# Patient Record
Sex: Female | Born: 1990
Health system: Southern US, Community
[De-identification: ages and names within clinical notes are randomized; demographics above are authoritative.]

## PROBLEM LIST (undated history)

## (undated) ENCOUNTER — Inpatient Hospital Stay (HOSPITAL_COMMUNITY): Payer: Self-pay

## (undated) DIAGNOSIS — O24419 Gestational diabetes mellitus in pregnancy, unspecified control: Secondary | ICD-10-CM

## (undated) DIAGNOSIS — O139 Gestational [pregnancy-induced] hypertension without significant proteinuria, unspecified trimester: Secondary | ICD-10-CM

## (undated) DIAGNOSIS — T4145XA Adverse effect of unspecified anesthetic, initial encounter: Secondary | ICD-10-CM

## (undated) DIAGNOSIS — T8859XA Other complications of anesthesia, initial encounter: Secondary | ICD-10-CM

## (undated) DIAGNOSIS — R112 Nausea with vomiting, unspecified: Secondary | ICD-10-CM

## (undated) DIAGNOSIS — Z9889 Other specified postprocedural states: Secondary | ICD-10-CM

## (undated) HISTORY — PX: BUNIONECTOMY: SHX129

## (undated) HISTORY — DX: Gestational (pregnancy-induced) hypertension without significant proteinuria, unspecified trimester: O13.9

## (undated) HISTORY — DX: Gestational diabetes mellitus in pregnancy, unspecified control: O24.419

---
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
## 2017-01-09 DIAGNOSIS — R030 Elevated blood-pressure reading, without diagnosis of hypertension: Secondary | ICD-10-CM | POA: Diagnosis not present

## 2017-01-24 DIAGNOSIS — R42 Dizziness and giddiness: Secondary | ICD-10-CM | POA: Diagnosis not present

## 2017-01-31 DIAGNOSIS — Z0001 Encounter for general adult medical examination with abnormal findings: Secondary | ICD-10-CM | POA: Diagnosis not present

## 2017-01-31 DIAGNOSIS — Z0000 Encounter for general adult medical examination without abnormal findings: Secondary | ICD-10-CM | POA: Diagnosis not present

## 2017-01-31 DIAGNOSIS — Z124 Encounter for screening for malignant neoplasm of cervix: Secondary | ICD-10-CM | POA: Diagnosis not present

## 2017-03-07 DIAGNOSIS — J029 Acute pharyngitis, unspecified: Secondary | ICD-10-CM | POA: Diagnosis not present

## 2017-05-01 DIAGNOSIS — M5137 Other intervertebral disc degeneration, lumbosacral region: Secondary | ICD-10-CM | POA: Diagnosis not present

## 2017-05-01 DIAGNOSIS — M9903 Segmental and somatic dysfunction of lumbar region: Secondary | ICD-10-CM | POA: Diagnosis not present

## 2017-05-01 DIAGNOSIS — M4607 Spinal enthesopathy, lumbosacral region: Secondary | ICD-10-CM | POA: Diagnosis not present

## 2017-05-03 DIAGNOSIS — M5137 Other intervertebral disc degeneration, lumbosacral region: Secondary | ICD-10-CM | POA: Diagnosis not present

## 2017-05-03 DIAGNOSIS — M4607 Spinal enthesopathy, lumbosacral region: Secondary | ICD-10-CM | POA: Diagnosis not present

## 2017-05-03 DIAGNOSIS — M9903 Segmental and somatic dysfunction of lumbar region: Secondary | ICD-10-CM | POA: Diagnosis not present

## 2017-05-08 DIAGNOSIS — M5137 Other intervertebral disc degeneration, lumbosacral region: Secondary | ICD-10-CM | POA: Diagnosis not present

## 2017-05-08 DIAGNOSIS — M9903 Segmental and somatic dysfunction of lumbar region: Secondary | ICD-10-CM | POA: Diagnosis not present

## 2017-05-08 DIAGNOSIS — M4607 Spinal enthesopathy, lumbosacral region: Secondary | ICD-10-CM | POA: Diagnosis not present

## 2017-05-11 DIAGNOSIS — M9903 Segmental and somatic dysfunction of lumbar region: Secondary | ICD-10-CM | POA: Diagnosis not present

## 2017-05-11 DIAGNOSIS — M4607 Spinal enthesopathy, lumbosacral region: Secondary | ICD-10-CM | POA: Diagnosis not present

## 2017-05-11 DIAGNOSIS — M5137 Other intervertebral disc degeneration, lumbosacral region: Secondary | ICD-10-CM | POA: Diagnosis not present

## 2017-05-15 DIAGNOSIS — M9903 Segmental and somatic dysfunction of lumbar region: Secondary | ICD-10-CM | POA: Diagnosis not present

## 2017-05-15 DIAGNOSIS — M5137 Other intervertebral disc degeneration, lumbosacral region: Secondary | ICD-10-CM | POA: Diagnosis not present

## 2017-05-15 DIAGNOSIS — M4607 Spinal enthesopathy, lumbosacral region: Secondary | ICD-10-CM | POA: Diagnosis not present

## 2017-05-17 DIAGNOSIS — M5137 Other intervertebral disc degeneration, lumbosacral region: Secondary | ICD-10-CM | POA: Diagnosis not present

## 2017-05-17 DIAGNOSIS — M9903 Segmental and somatic dysfunction of lumbar region: Secondary | ICD-10-CM | POA: Diagnosis not present

## 2017-05-17 DIAGNOSIS — M4607 Spinal enthesopathy, lumbosacral region: Secondary | ICD-10-CM | POA: Diagnosis not present

## 2017-07-17 DIAGNOSIS — N390 Urinary tract infection, site not specified: Secondary | ICD-10-CM | POA: Diagnosis not present

## 2017-08-04 DIAGNOSIS — N390 Urinary tract infection, site not specified: Secondary | ICD-10-CM | POA: Diagnosis not present

## 2017-08-25 DIAGNOSIS — J988 Other specified respiratory disorders: Secondary | ICD-10-CM | POA: Diagnosis not present

## 2017-10-12 DIAGNOSIS — Z3201 Encounter for pregnancy test, result positive: Secondary | ICD-10-CM | POA: Diagnosis not present

## 2017-10-26 DIAGNOSIS — Z3401 Encounter for supervision of normal first pregnancy, first trimester: Secondary | ICD-10-CM | POA: Diagnosis not present

## 2017-10-26 LAB — OB RESULTS CONSOLE ABO/RH: RH Type: POSITIVE

## 2017-10-26 LAB — OB RESULTS CONSOLE HEPATITIS B SURFACE ANTIGEN: Hepatitis B Surface Ag: NEGATIVE

## 2017-10-26 LAB — OB RESULTS CONSOLE ANTIBODY SCREEN: Antibody Screen: NEGATIVE

## 2017-10-26 LAB — OB RESULTS CONSOLE GC/CHLAMYDIA
CHLAMYDIA, DNA PROBE: NEGATIVE
GC PROBE AMP, GENITAL: NEGATIVE

## 2017-10-26 LAB — OB RESULTS CONSOLE RUBELLA ANTIBODY, IGM: Rubella: IMMUNE

## 2017-10-26 LAB — OB RESULTS CONSOLE RPR: RPR: NONREACTIVE

## 2017-10-26 LAB — OB RESULTS CONSOLE HIV ANTIBODY (ROUTINE TESTING): HIV: NONREACTIVE

## 2017-12-13 DIAGNOSIS — R1110 Vomiting, unspecified: Secondary | ICD-10-CM | POA: Diagnosis not present

## 2018-03-13 DIAGNOSIS — Z3A29 29 weeks gestation of pregnancy: Secondary | ICD-10-CM | POA: Diagnosis not present

## 2018-03-13 DIAGNOSIS — O99810 Abnormal glucose complicating pregnancy: Secondary | ICD-10-CM | POA: Diagnosis not present

## 2018-04-04 ENCOUNTER — Encounter: Payer: 59 | Attending: Obstetrics and Gynecology | Admitting: Registered"

## 2018-04-04 DIAGNOSIS — Z713 Dietary counseling and surveillance: Secondary | ICD-10-CM | POA: Insufficient documentation

## 2018-04-04 DIAGNOSIS — Z23 Encounter for immunization: Secondary | ICD-10-CM | POA: Diagnosis not present

## 2018-04-04 DIAGNOSIS — O99810 Abnormal glucose complicating pregnancy: Secondary | ICD-10-CM | POA: Insufficient documentation

## 2018-04-06 ENCOUNTER — Encounter: Payer: Self-pay | Admitting: Registered"

## 2018-04-06 DIAGNOSIS — O99810 Abnormal glucose complicating pregnancy: Secondary | ICD-10-CM | POA: Insufficient documentation

## 2018-04-06 NOTE — Progress Notes (Signed)
Patient was seen on 04/04/2018 for Gestational Diabetes self-management class at the [facility]. The following learning objectives were met by the patient during this course:   States the definition of Gestational Diabetes  States why dietary management is important in controlling blood glucose  Describes the effects each nutrient has on blood glucose levels  Demonstrates ability to create a balanced meal plan  Demonstrates carbohydrate counting   States when to check blood glucose levels  Demonstrates proper blood glucose monitoring techniques  States the effect of stress and exercise on blood glucose levels  States the importance of limiting caffeine and abstaining from alcohol and smoking  Blood glucose monitor given: [employer] Lot # [id number] X Exp: 02/24/19 Blood glucose reading: 116  Patient instructed to monitor glucose levels: FBS: 60 - <95; 1 hour: <140; 2 hour: <120  Patient received handouts:  Nutrition Diabetes and Pregnancy, including carb counting list  Patient will be seen for follow-up as needed.

## 2018-04-10 DIAGNOSIS — Z3482 Encounter for supervision of other normal pregnancy, second trimester: Secondary | ICD-10-CM | POA: Diagnosis not present

## 2018-04-10 DIAGNOSIS — Z3483 Encounter for supervision of other normal pregnancy, third trimester: Secondary | ICD-10-CM | POA: Diagnosis not present

## 2018-04-16 DIAGNOSIS — O24419 Gestational diabetes mellitus in pregnancy, unspecified control: Secondary | ICD-10-CM | POA: Diagnosis not present

## 2018-04-16 DIAGNOSIS — Z3A34 34 weeks gestation of pregnancy: Secondary | ICD-10-CM | POA: Diagnosis not present

## 2018-04-16 DIAGNOSIS — O3663X0 Maternal care for excessive fetal growth, third trimester, not applicable or unspecified: Secondary | ICD-10-CM | POA: Diagnosis not present

## 2018-04-24 DIAGNOSIS — Z3685 Encounter for antenatal screening for Streptococcus B: Secondary | ICD-10-CM | POA: Diagnosis not present

## 2018-04-24 LAB — OB RESULTS CONSOLE GBS: STREP GROUP B AG: NEGATIVE

## 2018-05-03 DIAGNOSIS — Z3A37 37 weeks gestation of pregnancy: Secondary | ICD-10-CM | POA: Diagnosis not present

## 2018-05-03 DIAGNOSIS — O368330 Maternal care for abnormalities of the fetal heart rate or rhythm, third trimester, not applicable or unspecified: Secondary | ICD-10-CM | POA: Diagnosis not present

## 2018-05-16 ENCOUNTER — Other Ambulatory Visit: Payer: Self-pay | Admitting: Obstetrics and Gynecology

## 2018-05-16 ENCOUNTER — Telehealth (HOSPITAL_COMMUNITY): Payer: Self-pay | Admitting: *Deleted

## 2018-05-16 ENCOUNTER — Encounter (HOSPITAL_COMMUNITY): Payer: Self-pay | Admitting: *Deleted

## 2018-05-16 NOTE — Telephone Encounter (Signed)
Preadmission screen  

## 2018-05-17 ENCOUNTER — Inpatient Hospital Stay (HOSPITAL_COMMUNITY)
Admission: AD | Admit: 2018-05-17 | Discharge: 2018-05-17 | Disposition: A | Discharge status: Home / Self Care | Payer: 59 | Source: Ambulatory Visit | Attending: Obstetrics and Gynecology | Admitting: Obstetrics and Gynecology

## 2018-05-17 ENCOUNTER — Other Ambulatory Visit: Payer: Self-pay

## 2018-05-17 ENCOUNTER — Encounter (HOSPITAL_COMMUNITY): Payer: Self-pay | Admitting: *Deleted

## 2018-05-17 DIAGNOSIS — Z3A39 39 weeks gestation of pregnancy: Secondary | ICD-10-CM | POA: Insufficient documentation

## 2018-05-17 DIAGNOSIS — O471 False labor at or after 37 completed weeks of gestation: Secondary | ICD-10-CM | POA: Insufficient documentation

## 2018-05-17 NOTE — Discharge Instructions (Signed)
Braxton Hicks Contractions °Contractions of the uterus can occur throughout pregnancy, but they are not always a sign that you are in labor. You may have practice contractions called Braxton Hicks contractions. These false labor contractions are sometimes confused with true labor. °What are Braxton Hicks contractions? °Braxton Hicks contractions are tightening movements that occur in the muscles of the uterus before labor. Unlike true labor contractions, these contractions do not result in opening (dilation) and thinning of the cervix. Toward the end of pregnancy (32-34 weeks), Braxton Hicks contractions can happen more often and may become stronger. These contractions are sometimes difficult to tell apart from true labor because they can be very uncomfortable. You should not feel embarrassed if you go to the hospital with false labor. °Sometimes, the only way to tell if you are in true labor is for your health care provider to look for changes in the cervix. The health care provider will do a physical exam and may monitor your contractions. If you are not in true labor, the exam should show that your cervix is not dilating and your water has not broken. °If there are other health problems associated with your pregnancy, it is completely safe for you to be sent home with false labor. You may continue to have Braxton Hicks contractions until you go into true labor. °How to tell the difference between true labor and false labor °True labor °· Contractions last 30-70 seconds. °· Contractions become very regular. °· Discomfort is usually felt in the top of the uterus, and it spreads to the lower abdomen and low back. °· Contractions do not go away with walking. °· Contractions usually become more intense and increase in frequency. °· The cervix dilates and gets thinner. °False labor °· Contractions are usually shorter and not as strong as true labor contractions. °· Contractions are usually irregular. °· Contractions  are often felt in the front of the lower abdomen and in the groin. °· Contractions may go away when you walk around or change positions while lying down. °· Contractions get weaker and are shorter-lasting as time goes on. °· The cervix usually does not dilate or become thin. °Follow these instructions at home: °· Take over-the-counter and prescription medicines only as told by your health care provider. °· Keep up with your usual exercises and follow other instructions from your health care provider. °· Eat and drink lightly if you think you are going into labor. °· If Braxton Hicks contractions are making you uncomfortable: °? Change your position from lying down or resting to walking, or change from walking to resting. °? Sit and rest in a tub of warm water. °? Drink enough fluid to keep your urine pale yellow. Dehydration may cause these contractions. °? Do slow and deep breathing several times an hour. °· Keep all follow-up prenatal visits as told by your health care provider. This is important. °Contact a health care provider if: °· You have a fever. °· You have continuous pain in your abdomen. °Get help right away if: °· Your contractions become stronger, more regular, and closer together. °· You have fluid leaking or gushing from your vagina. °· You pass blood-tinged mucus (bloody show). °· You have bleeding from your vagina. °· You have low back pain that you never had before. °· You feel your baby’s head pushing down and causing pelvic pressure. °· Your baby is not moving inside you as much as it used to. °Summary °· Contractions that occur before labor are called Braxton   Hicks contractions, false labor, or practice contractions. °· Braxton Hicks contractions are usually shorter, weaker, farther apart, and less regular than true labor contractions. True labor contractions usually become progressively stronger and regular and they become more frequent. °· Manage discomfort from Braxton Hicks contractions by  changing position, resting in a warm bath, drinking plenty of water, or practicing deep breathing. °This information is not intended to replace advice given to you by your health care provider. Make sure you discuss any questions you have with your health care provider. °Document Released: 01/26/2017 Document Revised: 01/26/2017 Document Reviewed: 01/26/2017 °Elsevier Interactive Patient Education © 2018 [employer] ° °

## 2018-05-17 NOTE — MAU Note (Addendum)
Having pretty regular contractions, was 3 cm on Tues. Getting intense, back is killing her.  Had small amt if ?water leakage, this morning once and once  this afternoon.  No bleeding.

## 2018-05-17 NOTE — MAU Provider Note (Signed)
Good FM Prodromal labor BP 133/87 (BP Location: Right Arm)   Pulse (!) 117   Temp 97.7 F (36.5 C) (Oral)   Resp 20   Ht 5' 8.5" (1.74 m)   Wt 112.7 kg   SpO2 100%   BMI 37.23 kg/m   Category 1 tracing DC home

## 2018-05-21 DIAGNOSIS — M5489 Other dorsalgia: Secondary | ICD-10-CM | POA: Diagnosis not present

## 2018-05-23 ENCOUNTER — Encounter (HOSPITAL_COMMUNITY)
Admission: RE | Disposition: A | Discharge status: Home / Self Care | Payer: Self-pay | Source: Home / Self Care | Attending: Obstetrics and Gynecology

## 2018-05-23 ENCOUNTER — Inpatient Hospital Stay (HOSPITAL_COMMUNITY): Payer: 59 | Admitting: Anesthesiology

## 2018-05-23 ENCOUNTER — Inpatient Hospital Stay (HOSPITAL_COMMUNITY)
Admission: RE | Admit: 2018-05-23 | Discharge: 2018-05-25 | DRG: 787 | Disposition: A | Discharge status: Home / Self Care | Payer: 59 | Attending: Obstetrics and Gynecology | Admitting: Obstetrics and Gynecology

## 2018-05-23 ENCOUNTER — Encounter (HOSPITAL_COMMUNITY): Payer: Self-pay

## 2018-05-23 DIAGNOSIS — Z3490 Encounter for supervision of normal pregnancy, unspecified, unspecified trimester: Secondary | ICD-10-CM | POA: Diagnosis present

## 2018-05-23 DIAGNOSIS — Z3A39 39 weeks gestation of pregnancy: Secondary | ICD-10-CM

## 2018-05-23 DIAGNOSIS — O24429 Gestational diabetes mellitus in childbirth, unspecified control: Secondary | ICD-10-CM | POA: Diagnosis not present

## 2018-05-23 DIAGNOSIS — O9081 Anemia of the puerperium: Secondary | ICD-10-CM | POA: Diagnosis not present

## 2018-05-23 DIAGNOSIS — O24420 Gestational diabetes mellitus in childbirth, diet controlled: Secondary | ICD-10-CM | POA: Diagnosis not present

## 2018-05-23 DIAGNOSIS — O3663X0 Maternal care for excessive fetal growth, third trimester, not applicable or unspecified: Secondary | ICD-10-CM | POA: Diagnosis not present

## 2018-05-23 DIAGNOSIS — D62 Acute posthemorrhagic anemia: Secondary | ICD-10-CM | POA: Diagnosis not present

## 2018-05-23 LAB — GLUCOSE, CAPILLARY
GLUCOSE-CAPILLARY: 76 mg/dL (ref 70–99)
Glucose-Capillary: 129 mg/dL — ABNORMAL HIGH (ref 70–99)
Glucose-Capillary: 83 mg/dL (ref 70–99)

## 2018-05-23 LAB — CBC
HCT: 33.8 % — ABNORMAL LOW (ref 36.0–46.0)
Hemoglobin: 11.2 g/dL — ABNORMAL LOW (ref 12.0–15.0)
MCH: 25.2 pg — ABNORMAL LOW (ref 26.0–34.0)
MCHC: 33.1 g/dL (ref 30.0–36.0)
MCV: 76.1 fL — ABNORMAL LOW (ref 78.0–100.0)
PLATELETS: 203 10*3/uL (ref 150–400)
RBC: 4.44 MIL/uL (ref 3.87–5.11)
RDW: 14.6 % (ref 11.5–15.5)
WBC: 16.8 10*3/uL — ABNORMAL HIGH (ref 4.0–10.5)

## 2018-05-23 LAB — ABO/RH: ABO/RH(D): O POS

## 2018-05-23 LAB — TYPE AND SCREEN
ABO/RH(D): O POS
ANTIBODY SCREEN: NEGATIVE

## 2018-05-23 SURGERY — Surgical Case
Anesthesia: Regional

## 2018-05-23 MED ORDER — PROMETHAZINE HCL 25 MG/ML IJ SOLN: 6.2500 mg | INTRAMUSCULAR | Status: DC | PRN

## 2018-05-23 MED ORDER — ACETAMINOPHEN 500 MG PO TABS
1000.0000 mg | ORAL_TABLET | Freq: Four times a day (QID) | ORAL | Status: AC
  Administered 2018-05-24 (×4): 1000 mg via ORAL
  Filled 2018-05-23 (×4): qty 2

## 2018-05-23 MED ORDER — ONDANSETRON HCL 4 MG/2ML IJ SOLN: 4.0000 mg | Freq: Three times a day (TID) | INTRAMUSCULAR | Status: DC | PRN

## 2018-05-23 MED ORDER — TETANUS-DIPHTH-ACELL PERTUSSIS 5-2.5-18.5 LF-MCG/0.5 IM SUSP: 0.5000 mL | Freq: Once | INTRAMUSCULAR | Status: DC

## 2018-05-23 MED ORDER — LACTATED RINGERS IV SOLN: 500.0000 mL | Freq: Once | INTRAVENOUS | Status: DC

## 2018-05-23 MED ORDER — NALBUPHINE HCL 10 MG/ML IJ SOLN: 5.0000 mg | INTRAMUSCULAR | Status: DC | PRN

## 2018-05-23 MED ORDER — SIMETHICONE 80 MG PO CHEW: 80.0000 mg | CHEWABLE_TABLET | ORAL | Status: DC | PRN

## 2018-05-23 MED ORDER — CEFAZOLIN SODIUM-DEXTROSE 2-3 GM-%(50ML) IV SOLR
INTRAVENOUS | Status: DC | PRN
  Administered 2018-05-23: 2 g via INTRAVENOUS

## 2018-05-23 MED ORDER — PRENATAL MULTIVITAMIN CH
1.0000 | ORAL_TABLET | Freq: Every day | ORAL | Status: DC
  Administered 2018-05-24 – 2018-05-25 (×2): 1 via ORAL
  Filled 2018-05-23 (×2): qty 1

## 2018-05-23 MED ORDER — KETOROLAC TROMETHAMINE 30 MG/ML IJ SOLN: 30.0000 mg | Freq: Once | INTRAMUSCULAR | Status: DC | PRN

## 2018-05-23 MED ORDER — OXYTOCIN 40 UNITS IN LACTATED RINGERS INFUSION - SIMPLE MED: 2.5000 [IU]/h | INTRAVENOUS | Status: DC

## 2018-05-23 MED ORDER — WITCH HAZEL-GLYCERIN EX PADS: 1.0000 "application " | MEDICATED_PAD | CUTANEOUS | Status: DC | PRN

## 2018-05-23 MED ORDER — STERILE WATER FOR IRRIGATION IR SOLN
Status: DC | PRN
  Administered 2018-05-23: 1000 mL

## 2018-05-23 MED ORDER — SCOPOLAMINE 1 MG/3DAYS TD PT72
MEDICATED_PATCH | TRANSDERMAL | Status: DC | PRN
  Administered 2018-05-23: 1 via TRANSDERMAL

## 2018-05-23 MED ORDER — LACTATED RINGERS IV SOLN
INTRAVENOUS | Status: DC
  Administered 2018-05-24: 05:00:00 via INTRAVENOUS

## 2018-05-23 MED ORDER — ONDANSETRON HCL 4 MG/2ML IJ SOLN
4.0000 mg | Freq: Four times a day (QID) | INTRAMUSCULAR | Status: DC | PRN
  Administered 2018-05-23: 4 mg via INTRAVENOUS
  Filled 2018-05-23: qty 2

## 2018-05-23 MED ORDER — SCOPOLAMINE 1 MG/3DAYS TD PT72
MEDICATED_PATCH | TRANSDERMAL | Status: AC
  Filled 2018-05-23: qty 1

## 2018-05-23 MED ORDER — DIPHENHYDRAMINE HCL 25 MG PO CAPS: 25.0000 mg | ORAL_CAPSULE | ORAL | Status: DC | PRN

## 2018-05-23 MED ORDER — TERBUTALINE SULFATE 1 MG/ML IJ SOLN: 0.2500 mg | Freq: Once | INTRAMUSCULAR | Status: DC | PRN

## 2018-05-23 MED ORDER — PHENYLEPHRINE 40 MCG/ML (10ML) SYRINGE FOR IV PUSH (FOR BLOOD PRESSURE SUPPORT)
80.0000 ug | PREFILLED_SYRINGE | INTRAVENOUS | Status: DC | PRN
  Administered 2018-05-23: 80 ug via INTRAVENOUS
  Filled 2018-05-23 (×2): qty 10

## 2018-05-23 MED ORDER — BUPIVACAINE HCL (PF) 0.5 % IJ SOLN
INTRAMUSCULAR | Status: AC
  Filled 2018-05-23: qty 30

## 2018-05-23 MED ORDER — ZOLPIDEM TARTRATE 5 MG PO TABS: 5.0000 mg | ORAL_TABLET | Freq: Every evening | ORAL | Status: DC | PRN

## 2018-05-23 MED ORDER — MORPHINE SULFATE (PF) 0.5 MG/ML IJ SOLN
INTRAMUSCULAR | Status: AC
  Filled 2018-05-23: qty 10

## 2018-05-23 MED ORDER — DIBUCAINE 1 % RE OINT: 1.0000 "application " | TOPICAL_OINTMENT | RECTAL | Status: DC | PRN

## 2018-05-23 MED ORDER — MORPHINE SULFATE (PF) 0.5 MG/ML IJ SOLN
INTRAMUSCULAR | Status: DC | PRN
  Administered 2018-05-23: 3 mg via EPIDURAL

## 2018-05-23 MED ORDER — SIMETHICONE 80 MG PO CHEW
80.0000 mg | CHEWABLE_TABLET | ORAL | Status: DC
  Administered 2018-05-24 (×2): 80 mg via ORAL
  Filled 2018-05-23 (×2): qty 1

## 2018-05-23 MED ORDER — FENTANYL 2.5 MCG/ML BUPIVACAINE 1/10 % EPIDURAL INFUSION (WH - ANES)
14.0000 mL/h | INTRAMUSCULAR | Status: DC | PRN
  Administered 2018-05-23: 14 mL/h via EPIDURAL
  Filled 2018-05-23: qty 100

## 2018-05-23 MED ORDER — MEPERIDINE HCL 25 MG/ML IJ SOLN: 6.2500 mg | INTRAMUSCULAR | Status: DC | PRN

## 2018-05-23 MED ORDER — DEXAMETHASONE SODIUM PHOSPHATE 10 MG/ML IJ SOLN
INTRAMUSCULAR | Status: DC | PRN
  Administered 2018-05-23: 10 mg via INTRAVENOUS

## 2018-05-23 MED ORDER — KETOROLAC TROMETHAMINE 30 MG/ML IJ SOLN: 30.0000 mg | Freq: Four times a day (QID) | INTRAMUSCULAR | Status: AC | PRN

## 2018-05-23 MED ORDER — NALBUPHINE HCL 10 MG/ML IJ SOLN: 5.0000 mg | Freq: Once | INTRAMUSCULAR | Status: DC | PRN

## 2018-05-23 MED ORDER — CEFAZOLIN SODIUM-DEXTROSE 2-4 GM/100ML-% IV SOLN
INTRAVENOUS | Status: AC
  Filled 2018-05-23: qty 100

## 2018-05-23 MED ORDER — METHYLERGONOVINE MALEATE 0.2 MG/ML IJ SOLN: 0.2000 mg | INTRAMUSCULAR | Status: DC | PRN

## 2018-05-23 MED ORDER — DIPHENHYDRAMINE HCL 50 MG/ML IJ SOLN: 12.5000 mg | INTRAMUSCULAR | Status: DC | PRN

## 2018-05-23 MED ORDER — SODIUM BICARBONATE 8.4 % IV SOLN
INTRAVENOUS | Status: DC | PRN
  Administered 2018-05-23: 10 mL via EPIDURAL

## 2018-05-23 MED ORDER — OXYTOCIN 10 UNIT/ML IJ SOLN
INTRAVENOUS | Status: DC | PRN
  Administered 2018-05-23: 40 [IU] via INTRAVENOUS

## 2018-05-23 MED ORDER — OXYTOCIN 40 UNITS IN LACTATED RINGERS INFUSION - SIMPLE MED
1.0000 m[IU]/min | INTRAVENOUS | Status: DC
  Administered 2018-05-23: 2 m[IU]/min via INTRAVENOUS
  Filled 2018-05-23: qty 1000

## 2018-05-23 MED ORDER — ACETAMINOPHEN 325 MG PO TABS: 650.0000 mg | ORAL_TABLET | ORAL | Status: DC | PRN

## 2018-05-23 MED ORDER — BUPIVACAINE HCL (PF) 0.25 % IJ SOLN
INTRAMUSCULAR | Status: DC | PRN
  Administered 2018-05-23: 30 mL

## 2018-05-23 MED ORDER — MENTHOL 3 MG MT LOZG: 1.0000 | LOZENGE | OROMUCOSAL | Status: DC | PRN

## 2018-05-23 MED ORDER — COCONUT OIL OIL: 1.0000 "application " | TOPICAL_OIL | Status: DC | PRN

## 2018-05-23 MED ORDER — EPHEDRINE 5 MG/ML INJ
10.0000 mg | INTRAVENOUS | Status: AC | PRN
  Administered 2018-05-23 (×2): 10 mg via INTRAVENOUS

## 2018-05-23 MED ORDER — OXYTOCIN BOLUS FROM INFUSION: 500.0000 mL | Freq: Once | INTRAVENOUS | Status: DC

## 2018-05-23 MED ORDER — LIDOCAINE HCL (PF) 1 % IJ SOLN
INTRAMUSCULAR | Status: DC | PRN
  Administered 2018-05-23: 4 mL via EPIDURAL
  Administered 2018-05-23: 9 mL via EPIDURAL
  Administered 2018-05-23: 4 mL via EPIDURAL

## 2018-05-23 MED ORDER — ONDANSETRON HCL 4 MG/2ML IJ SOLN
INTRAMUSCULAR | Status: DC | PRN
  Administered 2018-05-23: 4 mg via INTRAVENOUS

## 2018-05-23 MED ORDER — NALOXONE HCL 0.4 MG/ML IJ SOLN: 0.4000 mg | INTRAMUSCULAR | Status: DC | PRN

## 2018-05-23 MED ORDER — SODIUM CHLORIDE 0.9% FLUSH: 3.0000 mL | INTRAVENOUS | Status: DC | PRN

## 2018-05-23 MED ORDER — ONDANSETRON HCL 4 MG/2ML IJ SOLN
INTRAMUSCULAR | Status: AC
  Filled 2018-05-23: qty 2

## 2018-05-23 MED ORDER — OXYCODONE-ACETAMINOPHEN 5-325 MG PO TABS: 2.0000 | ORAL_TABLET | ORAL | Status: DC | PRN

## 2018-05-23 MED ORDER — PHENYLEPHRINE 40 MCG/ML (10ML) SYRINGE FOR IV PUSH (FOR BLOOD PRESSURE SUPPORT)
80.0000 ug | PREFILLED_SYRINGE | INTRAVENOUS | Status: AC | PRN
  Administered 2018-05-23 (×3): 80 ug via INTRAVENOUS

## 2018-05-23 MED ORDER — SCOPOLAMINE 1 MG/3DAYS TD PT72
1.0000 | MEDICATED_PATCH | Freq: Once | TRANSDERMAL | Status: DC
  Filled 2018-05-23: qty 1

## 2018-05-23 MED ORDER — OXYTOCIN 40 UNITS IN LACTATED RINGERS INFUSION - SIMPLE MED: 2.5000 [IU]/h | INTRAVENOUS | Status: AC

## 2018-05-23 MED ORDER — SENNOSIDES-DOCUSATE SODIUM 8.6-50 MG PO TABS
2.0000 | ORAL_TABLET | ORAL | Status: DC
  Administered 2018-05-24 (×2): 2 via ORAL
  Filled 2018-05-23 (×2): qty 2

## 2018-05-23 MED ORDER — LACTATED RINGERS IV SOLN
INTRAVENOUS | Status: DC | PRN
  Administered 2018-05-23: 20:00:00 via INTRAVENOUS

## 2018-05-23 MED ORDER — METHYLERGONOVINE MALEATE 0.2 MG PO TABS: 0.2000 mg | ORAL_TABLET | ORAL | Status: DC | PRN

## 2018-05-23 MED ORDER — LACTATED RINGERS AMNIOINFUSION
INTRAVENOUS | Status: DC
  Administered 2018-05-23: 16:00:00 via INTRAUTERINE

## 2018-05-23 MED ORDER — DEXAMETHASONE SODIUM PHOSPHATE 10 MG/ML IJ SOLN
INTRAMUSCULAR | Status: AC
  Filled 2018-05-23: qty 1

## 2018-05-23 MED ORDER — HYDROMORPHONE HCL 1 MG/ML IJ SOLN: 0.2500 mg | INTRAMUSCULAR | Status: DC | PRN

## 2018-05-23 MED ORDER — DIPHENHYDRAMINE HCL 25 MG PO CAPS: 25.0000 mg | ORAL_CAPSULE | Freq: Four times a day (QID) | ORAL | Status: DC | PRN

## 2018-05-23 MED ORDER — EPHEDRINE 5 MG/ML INJ
10.0000 mg | INTRAVENOUS | Status: DC | PRN
  Filled 2018-05-23: qty 4

## 2018-05-23 MED ORDER — SOD CITRATE-CITRIC ACID 500-334 MG/5ML PO SOLN
30.0000 mL | ORAL | Status: DC | PRN
  Administered 2018-05-23: 30 mL via ORAL
  Filled 2018-05-23: qty 15

## 2018-05-23 MED ORDER — OXYCODONE-ACETAMINOPHEN 5-325 MG PO TABS: 1.0000 | ORAL_TABLET | ORAL | Status: DC | PRN

## 2018-05-23 MED ORDER — OXYTOCIN 10 UNIT/ML IJ SOLN
INTRAMUSCULAR | Status: AC
  Filled 2018-05-23: qty 4

## 2018-05-23 MED ORDER — IBUPROFEN 600 MG PO TABS
600.0000 mg | ORAL_TABLET | Freq: Four times a day (QID) | ORAL | Status: DC
  Administered 2018-05-24 – 2018-05-25 (×7): 600 mg via ORAL
  Filled 2018-05-23 (×7): qty 1

## 2018-05-23 MED ORDER — NALOXONE HCL 4 MG/10ML IJ SOLN
1.0000 ug/kg/h | INTRAVENOUS | Status: DC | PRN
  Filled 2018-05-23: qty 5

## 2018-05-23 MED ORDER — SIMETHICONE 80 MG PO CHEW
80.0000 mg | CHEWABLE_TABLET | Freq: Three times a day (TID) | ORAL | Status: DC
  Administered 2018-05-24 – 2018-05-25 (×5): 80 mg via ORAL
  Filled 2018-05-23 (×5): qty 1

## 2018-05-23 MED ORDER — LIDOCAINE HCL (PF) 1 % IJ SOLN
30.0000 mL | INTRAMUSCULAR | Status: DC | PRN
  Filled 2018-05-23: qty 30

## 2018-05-23 MED ORDER — LACTATED RINGERS IV SOLN
INTRAVENOUS | Status: DC
  Administered 2018-05-23 (×5): via INTRAVENOUS

## 2018-05-23 MED ORDER — SODIUM CHLORIDE 0.9 % IR SOLN
Status: DC | PRN
  Administered 2018-05-23: 1000 mL

## 2018-05-23 MED ORDER — LACTATED RINGERS IV SOLN: 500.0000 mL | INTRAVENOUS | Status: DC | PRN

## 2018-05-23 SURGICAL SUPPLY — 34 items
CHLORAPREP W/TINT 26ML (MISCELLANEOUS) ×2 IMPLANT
CLAMP CORD UMBIL (MISCELLANEOUS) IMPLANT
CLOTH BEACON ORANGE TIMEOUT ST (SAFETY) ×2 IMPLANT
DECANTER SPIKE VIAL GLASS SM (MISCELLANEOUS) ×2 IMPLANT
DERMABOND ADHESIVE PROPEN (GAUZE/BANDAGES/DRESSINGS) ×1
DERMABOND ADVANCED .7 DNX6 (GAUZE/BANDAGES/DRESSINGS) ×1 IMPLANT
DRSG OPSITE POSTOP 4X10 (GAUZE/BANDAGES/DRESSINGS) ×2 IMPLANT
ELECT REM PT RETURN 9FT ADLT (ELECTROSURGICAL) ×2
ELECTRODE REM PT RTRN 9FT ADLT (ELECTROSURGICAL) ×1 IMPLANT
EXTRACTOR VACUUM M CUP 4 TUBE (SUCTIONS) IMPLANT
GLOVE BIO SURGEON STRL SZ7.5 (GLOVE) ×2 IMPLANT
GLOVE BIOGEL PI IND STRL 7.0 (GLOVE) ×1 IMPLANT
GLOVE BIOGEL PI INDICATOR 7.0 (GLOVE) ×1
GOWN STRL REUS W/TWL LRG LVL3 (GOWN DISPOSABLE) ×4 IMPLANT
KIT ABG SYR 3ML LUER SLIP (SYRINGE) IMPLANT
NEEDLE HYPO 22GX1.5 SAFETY (NEEDLE) ×2 IMPLANT
NEEDLE HYPO 25X5/8 SAFETYGLIDE (NEEDLE) IMPLANT
NEEDLE SPNL 20GX3.5 QUINCKE YW (NEEDLE) IMPLANT
NS IRRIG 1000ML POUR BTL (IV SOLUTION) ×2 IMPLANT
PACK C SECTION WH (CUSTOM PROCEDURE TRAY) ×2 IMPLANT
PENCIL SMOKE EVAC W/HOLSTER (ELECTROSURGICAL) ×2 IMPLANT
SUT MNCRL 0 VIOLET CTX 36 (SUTURE) ×2 IMPLANT
SUT MNCRL AB 3-0 PS2 27 (SUTURE) ×2 IMPLANT
SUT MON AB 2-0 CT1 27 (SUTURE) ×2 IMPLANT
SUT MON AB-0 CT1 36 (SUTURE) ×4 IMPLANT
SUT MONOCRYL 0 CTX 36 (SUTURE) ×2
SUT PLAIN 0 NONE (SUTURE) IMPLANT
SUT PLAIN 2 0 (SUTURE)
SUT PLAIN 2 0 XLH (SUTURE) ×2 IMPLANT
SUT PLAIN ABS 2-0 CT1 27XMFL (SUTURE) IMPLANT
SYR 20CC LL (SYRINGE) IMPLANT
SYR CONTROL 10ML LL (SYRINGE) ×2 IMPLANT
TOWEL OR 17X24 6PK STRL BLUE (TOWEL DISPOSABLE) ×2 IMPLANT
TRAY FOLEY W/BAG SLVR 14FR LF (SET/KITS/TRAYS/PACK) ×2 IMPLANT

## 2018-05-23 NOTE — Anesthesia Procedure Notes (Signed)
Epidural Patient location during procedure: OB Start time: 05/23/2018 10:30 AM End time: 05/23/2018 10:45 AM  Staffing Anesthesiologist: Elmer PickerWoodrum, Chelsey L, MD Performed: anesthesiologist   Preanesthetic Checklist Completed: patient identified, pre-op evaluation, timeout performed, IV checked, risks and benefits discussed and monitors and equipment checked  Epidural Patient position: sitting Prep: site prepped and draped and DuraPrep Patient monitoring: continuous pulse ox, blood pressure, heart rate and cardiac monitor Approach: midline Location: L3-L4 Injection technique: LOR air  Needle:  Needle type: Tuohy  Needle gauge: 17 G Needle length: 9 cm Needle insertion depth: 5.5 cm Catheter type: closed end flexible Catheter size: 19 Gauge Catheter at skin depth: 11 cm Test dose: negative  Assessment Sensory level: T8 Events: blood not aspirated, injection not painful, no injection resistance, negative IV test and no paresthesia  Additional Notes Reason for block:procedure for pain

## 2018-05-23 NOTE — Progress Notes (Signed)
Gabrielle Hughes is a 27 y.o. G1P0 at 659w6d by LMP admitted for induction of labor due to Gestational diabetes.  Subjective: Pushing x one hr  Objective: BP 112/72   Pulse (!) 145   Temp 97.8 F (36.6 C) (Axillary)   Resp 18   Ht 5\' 9"  (1.753 m)   Wt 112.9 kg   SpO2 98%   BMI 36.77 kg/m  I/O last 3 completed shifts: In: 2188.4 [I.V.:2188.4] Out: 250 [Urine:250] No intake/output data recorded.  FHT:  FHR: 140s  bpm, variability: moderate,  accelerations:  Abscent,  decelerations:  Present deep and prolonged variables with pushing UC:   regular, every 2 minutes SVE:   Dilation: 10 Effacement (%): 100 Station: Plus 1 Exam by:: Gabrielle HausenAshley Gray RN   Labs: Lab Results  Component Value Date   WBC 16.8 (H) 05/23/2018   HGB 11.2 (L) 05/23/2018   HCT 33.8 (L) 05/23/2018   MCV 76.1 (L) 05/23/2018   PLT 203 05/23/2018    Assessment / Plan: Non reassuring FHR  Labor: no progress Preeclampsia:  no signs or symptoms of toxicity Fetal Wellbeing:  Category II and Category III Pain Control:  Epidural I/D:  n/a Anticipated MOD:  Proceed with csection. Risks vs benefits discussed. Proceed with csection Consent done.  Gabrielle Hughes J 05/23/2018, 7:03 PM

## 2018-05-23 NOTE — H&P (Signed)
Gabrielle Hughes is a 27 y.o. female presenting for IOL at 40 weeks . Presumed LGA. Favorable cervix. OB History    Gravida  1   Para      Term      Preterm      AB      Living        SAB      TAB      Ectopic      Multiple      Live Births             Past Medical History:  Diagnosis Date  . Gestational diabetes   . Pregnancy induced hypertension    Past Surgical History:  Procedure Laterality Date  . BUNIONECTOMY     rt and left   Family History: family history includes Cancer in her paternal grandfather; Hypertension in her mother. Social History:  reports that she has never smoked. She has never used smokeless tobacco. She reports that she drank alcohol. She reports that she does not use drugs.     Maternal Diabetes: No Genetic Screening: Normal Maternal Ultrasounds/Referrals: Normal Fetal Ultrasounds or other Referrals:  None Maternal Substance Abuse:  No Significant Maternal Medications:  None Significant Maternal Lab Results:  None Other Comments:  None  Review of Systems  Constitutional: Negative.   All other systems reviewed and are negative.  Maternal Medical History:  Fetal activity: Perceived fetal activity is normal.   Last perceived fetal movement was within the past hour.    Prenatal complications: no prenatal complications Prenatal Complications - Diabetes: none.    Dilation: 6 Effacement (%): 80 Station: -2 Exam by:: Johnathan HausenAshley Gray RN  Blood pressure 120/69, pulse 77, temperature 98.3 F (36.8 C), temperature source Oral, resp. rate 16, height 5\' 9"  (1.753 m), weight 112.9 kg, SpO2 98 %. Maternal Exam:  Uterine Assessment: Contraction strength is mild.  Contraction frequency is irregular.   Abdomen: Patient reports no abdominal tenderness. Fetal presentation: vertex  Introitus: Normal vulva. Normal vagina.  Ferning test: not done.  Nitrazine test: not done. Amniotic fluid character: not assessed.  Pelvis: questionable for  delivery.   Cervix: Cervix evaluated by digital exam.     Physical Exam  Nursing note and vitals reviewed. Constitutional: She is oriented to person, place, and time. She appears well-developed and well-nourished.  HENT:  Head: Normocephalic and atraumatic.  Neck: Normal range of motion. Neck supple.  Cardiovascular: Normal rate and regular rhythm.  Respiratory: Effort normal and breath sounds normal.  GI: Bowel sounds are normal.  Genitourinary: Vagina normal and uterus normal.  Musculoskeletal: Normal range of motion.  Neurological: She is alert and oriented to person, place, and time. She has normal reflexes.  Skin: Skin is warm and dry.  Psychiatric: She has a normal mood and affect.    Prenatal labs: ABO, Rh: --/--/O POS (08/28 16100729) Antibody: NEG (08/28 0729) Rubella: Immune (01/31 0000) RPR: Nonreactive (01/31 0000)  HBsAg: Negative (01/31 0000)  HIV: Non-reactive (01/31 0000)  GBS: Negative (07/30 0000)   Assessment/Plan: 40 wk IUP IOL   Gabrielle Hughes 05/23/2018, 1:42 PM

## 2018-05-23 NOTE — Progress Notes (Signed)
Gabrielle Hughes is a 27 y.o. G1P0 at 6736w6d by LMP admitted for induction of labor due to Gestational diabetes.  Subjective: comfortable  Objective: BP (!) 116/51   Pulse (!) 127   Temp 97.6 F (36.4 C) (Oral)   Resp 18   Ht 5\' 9"  (1.753 m)   Wt 112.9 kg   SpO2 98%   BMI 36.77 kg/m  No intake/output data recorded. Total I/O In: 2188.4 [I.V.:2188.4] Out: -   FHT:  FHR: 140 bpm, variability: moderate,  accelerations:  Present,  decelerations:  Present intermittent mild variable UC:   irregular, every 2-5 minutes SVE:   Dilation: 8 Effacement (%): 90 Station: -1 Exam by:: Johnathan HausenAshley Gray RN  Inadequate MVU Labs: Lab Results  Component Value Date   WBC 16.8 (H) 05/23/2018   HGB 11.2 (L) 05/23/2018   HCT 33.8 (L) 05/23/2018   MCV 76.1 (L) 05/23/2018   PLT 203 05/23/2018    Assessment / Plan: Induction of labor due to gestational diabetes,  progressing well on pitocin  Labor: Progressing normally Preeclampsia:  no signs or symptoms of toxicity Fetal Wellbeing:  Category I and Category II Pain Control:  Epidural I/D:  n/a Anticipated MOD:  NSVD  Gabrielle Hughes 05/23/2018, 3:45 PM

## 2018-05-23 NOTE — Transfer of Care (Signed)
Immediate Anesthesia Transfer of Care Note  Patient: Gabrielle Hughes  Procedure(s) Performed: CESAREAN SECTION (N/A )  Patient Location: PACU  Anesthesia Type:Epidural  Level of Consciousness: awake, alert  and oriented  Airway & Oxygen Therapy: Patient Spontanous Breathing  Post-op Assessment: Report given to RN and Post -op Vital signs reviewed and stable  Post vital signs: Reviewed and stable  Last Vitals:  Vitals Value Taken Time  BP 111/48 05/23/2018  8:12 PM  Temp    Pulse 95 05/23/2018  8:15 PM  Resp 16 05/23/2018  8:15 PM  SpO2 97 % 05/23/2018  8:15 PM  Vitals shown include unvalidated device data.  Last Pain:  Vitals:   05/23/18 1830  TempSrc: Axillary  PainSc:          Complications: No apparent anesthesia complications

## 2018-05-23 NOTE — Anesthesia Pain Management Evaluation Note (Signed)
  CRNA Pain Management Visit Note  Patient: Gabrielle Hughes, 27 y.o., female  "Hello I am a member of the anesthesia team at [facility]. We have an anesthesia team available at all times to provide care throughout the hospital, including epidural management and anesthesia for C-section. I don't know your plan for the delivery whether it a natural birth, water birth, IV sedation, nitrous supplementation, doula or epidural, but we want to meet your pain goals."   1.Was your pain managed to your expectations on prior hospitalizations?   No prior hospitalizations  2.What is your expectation for pain management during this hospitalization?     Epidural  3.How can we help you reach that goal? Epidural when desired.   Record the patient's initial score and the patient's pain goal.   Pain: 3  Pain Goal: 5 [facility] wants you to be able to say your pain was always managed very well.  Manna Gose 05/23/2018

## 2018-05-23 NOTE — Op Note (Signed)
Cesarean Section Procedure Note  Indications: non-reassuring fetal status  Pre-operative Diagnosis: 40 week 0 day pregnancy.  Post-operative Diagnosis: same  Surgeon: Lenoard AdenAAVON,Kelce Bouton J   Assistants: Jeralyn BennettLawhorn, CNM  Anesthesia: Epidural anesthesia and Local anesthesia 0.25.% bupivacaine  ASA Class: 2  Procedure Details  The patient was seen in the Holding Room. The risks, benefits, complications, treatment options, and expected outcomes were discussed with the patient.  The patient concurred with the proposed plan, giving informed consent. The risks of anesthesia, infection, bleeding and possible injury to other organs discussed. Injury to bowel, bladder, or ureter with possible need for repair discussed. Possible need for transfusion with secondary risks of hepatitis or HIV acquisition discussed. Post operative complications to include but not limited to DVT, PE and Pneumonia noted. The site of surgery properly noted/marked. The patient was taken to Operating Room # 9, identified as Gabrielle Hughes and the procedure verified as C-Section Delivery. A Time Out was held and the above information confirmed.  After induction of anesthesia, the patient was draped and prepped in the usual sterile manner. A Pfannenstiel incision was made and carried down through the subcutaneous tissue to the fascia. Fascial incision was made and extended transversely using Mayo scissors. The fascia was separated from the underlying rectus tissue superiorly and inferiorly. The peritoneum was identified and entered. Peritoneal incision was extended longitudinally. The utero-vesical peritoneal reflection was incised transversely and the bladder flap was bluntly freed from the lower uterine segment. A low transverse uterine incision(Kerr hysterotomy) was made. Delivered from OP presentation was a  female with Apgar scores of 8 at one minute and 9 at five minutes. Bulb suctioning gently performed. Neonatal team in attendance.After  the umbilical cord was clamped and cut cord blood was obtained for evaluation. The placenta was removed intact and appeared normal. The uterus was curetted with a dry lap pack. Good hemostasis was noted.The uterine outline, tubes and ovaries appeared normal. The uterine incision was closed with running locked sutures of 0 Monocryl x 2 layers. Hemostasis was observed. The parietal peritoneum was closed with a running 2-0 Monocryl suture. The fascia was then reapproximated with running sutures of 0 Monocryl. The skin was reapproximated with 3-0 monocryl after Atlantic closure with 2-0 plain.  Instrument, sponge, and needle counts were correct prior the abdominal closure and at the conclusion of the case.   Findings: FTLM, OP, post placenta. NL adnexa  Estimated Blood Loss:  500         Drains: foley                 Specimens: placenta                 Complications:  None; patient tolerated the procedure well.         Disposition: PACU - hemodynamically stable.         Condition: stable  Attending Attestation: I performed the procedure.

## 2018-05-23 NOTE — Anesthesia Preprocedure Evaluation (Signed)
Anesthesia Evaluation  Patient identified by MRN, date of birth, ID band Patient awake    Reviewed: Allergy & Precautions, NPO status , Patient's Chart, lab work & pertinent test results  Airway Mallampati: II  TM Distance: >3 FB Neck ROM: Full    Dental no notable dental hx. (+) Teeth Intact   Pulmonary neg pulmonary ROS,    Pulmonary exam normal breath sounds clear to auscultation       Cardiovascular negative cardio ROS Normal cardiovascular exam Rhythm:Regular Rate:Normal     Neuro/Psych negative neurological ROS  negative psych ROS   GI/Hepatic negative GI ROS, Neg liver ROS,   Endo/Other  negative endocrine ROS  Renal/GU negative Renal ROS  negative genitourinary   Musculoskeletal negative musculoskeletal ROS (+)   Abdominal   Peds  Hematology negative hematology ROS (+)   Anesthesia Other Findings   Reproductive/Obstetrics (+) Pregnancy gHTN, gDM                             Anesthesia Physical Anesthesia Plan  ASA: III  Anesthesia Plan: Epidural   Post-op Pain Management:    Induction:   PONV Risk Score and Plan: 2 and Treatment may vary due to age or medical condition  Airway Management Planned: Natural Airway  Additional Equipment:   Intra-op Plan:   Post-operative Plan:   Informed Consent: I have reviewed the patients History and Physical, chart, labs and discussed the procedure including the risks, benefits and alternatives for the proposed anesthesia with the patient or authorized representative who has indicated his/her understanding and acceptance.     Plan Discussed with: Anesthesiologist  Anesthesia Plan Comments:         Anesthesia Quick Evaluation

## 2018-05-23 NOTE — Progress Notes (Signed)
Gabrielle Hughes is a 27 y.o. G1P0 at 1345w6d by LMP admitted for induction of labor due to Gestational diabetes.  Subjective: Occ pressure  Objective: BP 118/68   Pulse 88   Temp 97.7 F (36.5 C) (Axillary)   Resp 18   Ht 5\' 9"  (1.753 m)   Wt 112.9 kg   SpO2 97%   BMI 36.77 kg/m  No intake/output data recorded. Total I/O In: 2188.4 [I.V.:2188.4] Out: 250 [Urine:250]  FHT:  FHR: 150-160  bpm, variability: moderate,  accelerations:  Present,  decelerations:  Present intermittent variable UC:   irregular, every 2-5 minutes Fetal bradycardia now resolved. FHR 80-90. FSE placed. Slow recovery to baseline. SVE:   9-10/100/0  Labs: Lab Results  Component Value Date   WBC 16.8 (H) 05/23/2018   HGB 11.2 (L) 05/23/2018   HCT 33.8 (L) 05/23/2018   MCV 76.1 (L) 05/23/2018   PLT 203 05/23/2018    Assessment / Plan: Induction of labor due to gestational diabetes,  progressing well on pitocin  Fetal bradycardia now resolved  Labor: Progressing normally Preeclampsia:  no signs or symptoms of toxicity Fetal Wellbeing:  Category I and Category II Pain Control:  Epidural I/D:  n/a Anticipated MOD:  guarded  Davia Smyre J 05/23/2018, 4:53 PM

## 2018-05-23 NOTE — Anesthesia Postprocedure Evaluation (Signed)
Anesthesia Post Note  Patient: Gabrielle Hughes  Procedure(s) Performed: CESAREAN SECTION (N/A )     Patient location during evaluation: PACU Anesthesia Type: Epidural Level of consciousness: awake Pain management: pain level controlled Vital Signs Assessment: post-procedure vital signs reviewed and stable Respiratory status: spontaneous breathing Cardiovascular status: stable Postop Assessment: no headache, no backache, epidural receding, patient able to bend at knees and no apparent nausea or vomiting Anesthetic complications: no    Last Vitals:  Vitals:   05/23/18 2100 05/23/18 2115  BP: (!) 102/55 111/76  Pulse: 71 79  Resp: (!) 21 12  Temp: 36.5 C   SpO2: 100% 100%    Last Pain:  Vitals:   05/23/18 2115  TempSrc:   PainSc: 0-No pain   Pain Goal:                 Tove Wideman JR,JOHN Taydem Cavagnaro

## 2018-05-24 ENCOUNTER — Encounter (HOSPITAL_COMMUNITY): Payer: Self-pay | Admitting: Obstetrics and Gynecology

## 2018-05-24 LAB — CBC
HEMATOCRIT: 30.2 % — AB (ref 36.0–46.0)
HEMOGLOBIN: 9.8 g/dL — AB (ref 12.0–15.0)
MCH: 24.6 pg — ABNORMAL LOW (ref 26.0–34.0)
MCHC: 32.5 g/dL (ref 30.0–36.0)
MCV: 75.9 fL — AB (ref 78.0–100.0)
Platelets: 198 10*3/uL (ref 150–400)
RBC: 3.98 MIL/uL (ref 3.87–5.11)
RDW: 14.5 % (ref 11.5–15.5)
WBC: 26.6 10*3/uL — ABNORMAL HIGH (ref 4.0–10.5)

## 2018-05-24 LAB — RPR: RPR: NONREACTIVE

## 2018-05-24 LAB — GLUCOSE, CAPILLARY: GLUCOSE-CAPILLARY: 106 mg/dL — AB (ref 70–99)

## 2018-05-24 NOTE — Anesthesia Postprocedure Evaluation (Signed)
Anesthesia Post Note  Patient: Novali Bernier  Procedure(s) Performed: CESAREAN SECTION (N/A )     Patient location during evaluation: Mother Baby Anesthesia Type: Epidural Level of consciousness: awake, awake and alert and oriented Pain management: pain level controlled Vital Signs Assessment: post-procedure vital signs reviewed and stable Respiratory status: spontaneous breathing, nonlabored ventilation and respiratory function stable Cardiovascular status: stable Postop Assessment: no headache, no backache, patient able to bend at knees, no apparent nausea or vomiting, adequate PO intake and able to ambulate Anesthetic complications: no    Last Vitals:  Vitals:   05/24/18 0400 05/24/18 0628  BP:    Pulse:    Resp:  18  Temp:  36.7 C  SpO2: 100% 100%    Last Pain:  Vitals:   05/24/18 0628  TempSrc: Oral  PainSc: 0-No pain   Pain Goal:                 Mykell Rawl

## 2018-05-24 NOTE — Lactation Note (Signed)
This note was copied from a baby's chart. Lactation Consultation Note Baby 25 hrs old. Baby hasn't started cluster feeding at this time.  Mom has PIH. A lot of edema to entire body. Mom stated breast has been the same, just a little fuller, heavy. LC feels has edema to breast as well. Reverse pressure to evert nipples more. Mom wearing shells in bra. Noted short shaft everted nipples. Mom stated they were flat. Not very compressible. Hand expression w/breast massage. Nothing noted. NS demonstrated. Mom applied NS. Had #24. Mom stated pinching. #24 to large for baby's mouth. Both lips rolling in, difficulty flanging lips. #20 NS fitted. Latched baby. Mom stated much better after flanged flared.  Baby has slight recessed chin.  No swallows heard. After baby feeding for 20 min. No colostrum noted in NS. Hand expression for several minutes w/a very small amount of clear colostrum noted. Mom pat on nipples. Noted after #24 NS nipple pinched w/small white positional stripe. Mom stated baby is clamping heard. After #20 NS applied, mom stated felt better. Nipple fuller w/slight compressed line. Small blisters possible. Comfort gels given.  Encouraged mom to cont. To wear shells, mom reapplied in bra after feeding. Pre-pump prior to application of NS. After latching support breast w/"C" hold.  Discussed body alignment, positioning, support, STS, I&O, cluster feeding, supply and demand. Asked RN to set up DEBP. Answered mom's questions.  Encouraged to call for questions or assistance. Noted baby jittery, even chin when crying. Mom stated baby has been jittery. Reported to RN. Encouraged to call for assistance if needed. Mom encouraged to feed baby 8-12 times/24 hours and with feeding cues.   Patient Name: Boy Marrianne MoodJimmi Guadarrama ZOXWR'UToday's Date: 05/24/2018     Maternal Data    Feeding    LATCH Score                   Interventions    Lactation Tools Discussed/Used     Consult  Status      Charyl DancerCARVER, Gearl Baratta G 05/24/2018, 10:28 PM

## 2018-05-24 NOTE — Lactation Note (Signed)
This note was copied from a baby's chart. Lactation Consultation Note  Patient Name: Gabrielle Marrianne MoodJimmi Paulette WUJWJ'XToday's Date: 05/24/2018 Reason for consult: Initial assessment;Maternal endocrine disorder;Term;1st time breastfeeding Type of Endocrine Disorder?: Diabetes P1, 6 hours old female infant, c/s delivery, GDM Baby is having little mucous and spity.  LC entered room infant was being feed 7 ml of formula by slow flow nipple due low blood sugar by nurse. Mom BF infant in L&D LC notice nipple trauma w/ abrasions  on  mom's  left breast when she demonstrated hand expression.  Mom has nipple swelling and nipples are flat both breast. Mom given breast shells to help w/ nipple edema. Mom will EBM leave on nipples to help w/ healing and comfort gels given. Mom will call RN or LC to help w/ next latch when infant starts cuing to feed. LC discussed hunger cues, mom  will feed according cues, 8 to 12 times within 24 hours including nights. Reviewed Baby & Me book's Breastfeeding Basics.  Mom made aware of O/P services, breastfeeding support groups, community resources, and our phone # for post-discharge questions.   Maternal Data Formula Feeding for Exclusion: No Has patient been taught Hand Expression?: Yes([facility]ost[facility] to [name]) Does the patient have breastfeeding experience prior to this delivery?: No  Feeding Feeding Type: Bottle Fed - Formula Nipple Type: Slow - flow Length of feed: 0 min  LATCH Score Latch: Repeated attempts needed to sustain latch, nipple held in mouth throughout feeding, stimulation needed to elicit sucking reflex.  Audible Swallowing: None  Type of Nipple: Flat  Comfort (Breast/Nipple): Soft / non-tender  Hold (Positioning): Full assist, staff holds infant at breast  LATCH Score: 4  Interventions Interventions: Comfort gels  Lactation Tools Discussed/Used WIC Program: No   Consult Status      Gabrielle Hughes 05/24/2018, 2:16 AM

## 2018-05-24 NOTE — Addendum Note (Signed)
Addendum  created 05/24/18 1028 by Conard Alvira, Doree Fudgeolleen S, CRNA   Charge Capture section accepted, Sign clinical note, Visit diagnoses modified

## 2018-05-24 NOTE — Progress Notes (Addendum)
Post Partum Day 1 sp Csection Subjective: no complaints, up ad lib, voiding, tolerating PO and + flatus  Objective: Blood pressure (!) 128/58, pulse (!) 56, temperature 98.1 F (36.7 C), temperature source Oral, resp. rate 18, height 5\' 9"  (1.753 m), weight 112.9 kg, SpO2 100 %, unknown if currently breastfeeding.  Physical Exam:  General: alert, cooperative and appears stated age Lochia: appropriate Uterine Fundus: firm Incision: healing well, no significant drainage DVT Evaluation: No evidence of DVT seen on physical exam. No cords or calf tenderness.  Recent Labs    05/23/18 0729 05/24/18 0539  HGB 11.2* 9.8*  HCT 33.8* 30.2*    Assessment/Plan: Stable POD 1 ABL anemia Routine pP care Breastfeeding, Lactation consult and Circumcision prior to discharge   LOS: 1 day   Gabrielle Hughes J 05/24/2018, 10:27 AM

## 2018-05-24 NOTE — Progress Notes (Signed)
PACU care done by Gwendolyn GrantMadison Jesselle Laflamme, RN.

## 2018-05-25 LAB — BIRTH TISSUE RECOVERY COLLECTION (PLACENTA DONATION)

## 2018-05-25 MED ORDER — IBUPROFEN 600 MG PO TABS: 600.0000 mg | ORAL_TABLET | Freq: Four times a day (QID) | ORAL | 0 refills | Status: DC

## 2018-05-25 MED ORDER — SENNOSIDES-DOCUSATE SODIUM 8.6-50 MG PO TABS: 2.0000 | ORAL_TABLET | ORAL | 0 refills | Status: DC

## 2018-05-25 MED ORDER — POLYSACCHARIDE IRON COMPLEX 150 MG PO CAPS
150.0000 mg | ORAL_CAPSULE | Freq: Every day | ORAL | Status: DC
  Administered 2018-05-25: 150 mg via ORAL
  Filled 2018-05-25: qty 1

## 2018-05-25 MED ORDER — MAGNESIUM OXIDE 400 (241.3 MG) MG PO TABS: 400.0000 mg | ORAL_TABLET | Freq: Every day | ORAL | 0 refills | Status: DC

## 2018-05-25 MED ORDER — SIMETHICONE 80 MG PO CHEW: 80.0000 mg | CHEWABLE_TABLET | Freq: Three times a day (TID) | ORAL | 0 refills | Status: DC

## 2018-05-25 MED ORDER — MAGNESIUM OXIDE 400 (241.3 MG) MG PO TABS
400.0000 mg | ORAL_TABLET | Freq: Every day | ORAL | Status: DC
  Administered 2018-05-25: 400 mg via ORAL
  Filled 2018-05-25 (×2): qty 1

## 2018-05-25 MED ORDER — POLYSACCHARIDE IRON COMPLEX 150 MG PO CAPS: 150.0000 mg | ORAL_CAPSULE | Freq: Every day | ORAL | 0 refills | Status: DC

## 2018-05-25 NOTE — Discharge Summary (Signed)
Obstetric Discharge Summary  Patient ID: Gabrielle MoodJimmi Lebeck MRN: [id number] DOB/AGE: January 04, 1991 27 y.o.   Date of Admission: 05/23/2018  Date of Discharge:  05/25/18  Admitting Diagnosis: Induction of labor at 45110w6d  Secondary Diagnosis: Gestational diabetes diet controlled (A1)  Mode of Delivery: primary cesarean section- low uterine, transverse     Discharge Diagnosis: No other diagnosis   Intrapartum Procedures: Atificial rupture of membranes, epidural and pitocin augmentation   Post partum procedures: None  Complications: None   Brief Hospital Course    Jarica Weston [place] is a G1P1001 who underwent cesarean section on 05/23/2018.  Patient had an uncomplicated surgery; for further details of this surgery, please refer to the operative note.  Patient had an uncomplicated postpartum course.  By time of discharge on PPD#2, her pain was controlled on oral pain medications; she had appropriate lochia and was ambulating, voiding without difficulty, tolerating regular diet and passing flatus.   She was deemed stable for discharge to home.    Labs:  CBC Latest Ref Rng & Units 05/24/2018 05/23/2018  WBC 4.0 - 10.5 K/uL 26.6(H) 16.8(H)  Hemoglobin 12.0 - 15.0 g/dL 1.3(Y9.8(L) 11.2(L)  Hematocrit 36.0 - 46.0 % 30.2(L) 33.8(L)  Platelets 150 - 400 K/uL 198 203   Conflict (See Lab Report): O POS/O POS Performed at [facility], [address] [place], [place] [address]   Physical exam:   Temp:  [97.6 F (36.4 C)-98.2 F (36.8 C)] 98.2 F (36.8 C) (08/30 0548) Pulse Rate:  [68-86] 68 (08/29 2253) Resp:  [18] 18 (08/30 0548) BP: (113-123)/(55-89) 122/89 (08/30 0548) SpO2:  [94 %-99 %] 99 % (08/30 0548)  General: alert and no distress  Lochia: appropriate  Abdomen: soft, NT  Uterine Fundus: firm  Incision: healing well, no significant drainage, no dehiscence, no significant erythema; covered by dressing  Extremities: No evidence of DVT seen on physical exam. No lower extremity  edema.  Discharge Instructions: Per After Visit Summary.  Activity: Advance as tolerated. Pelvic rest for 6 weeks.  Also refer to After Visit Summary  Diet: Regular  Medications: Allergies as of 05/25/2018      Reactions   Peanut-containing Drug Products Anaphylaxis      Medication List    TAKE these medications   ibuprofen 600 MG tablet Commonly known as:  ADVIL,MOTRIN Take 1 tablet (600 mg total) by mouth every 6 (six) hours.   iron polysaccharides 150 MG capsule Commonly known as:  NIFEREX Take 1 capsule (150 mg total) by mouth daily. Start taking on:  05/26/2018   magnesium oxide 400 (241.3 Mg) MG tablet Commonly known as:  MAG-OX Take 1 tablet (400 mg total) by mouth daily. Start taking on:  05/26/2018   prenatal multivitamin Tabs tablet Take 1 tablet by mouth daily at 12 noon.   senna-docusate 8.6-50 MG tablet Commonly known as:  Senokot-S Take 2 tablets by mouth daily. Start taking on:  05/26/2018   simethicone 80 MG chewable tablet Commonly known as:  MYLICON Chew 1 tablet (80 mg total) by mouth 3 (three) times daily after meals.      Outpatient follow up:  Follow-up Information    Olivia Mackieaavon, Richard, MD. Call in 6 week(s).   Specialty:  Obstetrics and Gynecology Why:  The office will call you to schedule a six (6) week postpartum visit Contact information: [address] [place] [address] [phone]          Discharged Condition: stable  Discharged to: home   Newborn Data:  Disposition:home with  mother  Apgars: APGAR (1 MIN): 8   APGAR (5 MINS): 9    Baby Feeding: Breast   Gunnar Bulla, CNM [facility] 05/25/18 12:01 PM

## 2018-05-25 NOTE — Progress Notes (Signed)
Patient ID: Marrianne MoodJimmi Reid, female   DOB: [birth year], 27 y.o.   MRN: [id number]  Post Partum Day # 2, s/p primary cesarean section for non reassuring fetal heart tones remote from delivery, persistent OP positioning  Subjective:  Patient ambulation in room. Eating, drinking and passing gas without difficulty. Infant sleeping in bassinet at bedside.   Denies difficulty breathing or respiratory distress, chest pain, abdominal pain, excessive vaginal bleeding, dysuria, and leg pain.   Objective:  Temp:  [97.6 F (36.4 C)-98.2 F (36.8 C)] 98.2 F (36.8 C) (08/30 0548) Pulse Rate:  [61-86] 68 (08/29 2253) Resp:  [18] 18 (08/30 0548) BP: (109-123)/(55-89) 122/89 (08/30 0548) SpO2:  [94 %-99 %] 99 % (08/30 0548)  Physical Exam:   General: alert and cooperative   Lungs: clear to auscultation bilaterally  Breasts: normal appearance, no masses or tenderness  Heart: normal apical impulse  Abdomen: soft, non-tender; bowel sounds normal; no masses,  no organomegaly; incision covered by dressing-clean, dry, and intact  Pelvis: Lochia: appropriate, Uterine Fundus: firm  Extremities: DVT Evaluation: No evidence of DVT seen on physical exam.  Recent Labs    05/23/18 0729 05/24/18 0539  HGB 11.2* 9.8*  HCT 33.8* 30.2*    Assessment:  27 year old G1P1, postpartum day #2, s/p primary cesarean section for non reassuring fetal heart tones remote from delivery, Rh positive, blood loss anemia  Breastfeeding  Plan:  Iron and magnesium supplementation, see orders.   Routine postpartum care and education.   Reviewed red flag symptoms and when to call.   Continue orders as written. Reassess as needed.   Anticipate discharge tomorrow.    LOS: 2 days    Gunnar BullaJenkins Michelle Aleeza [place], CNM [facility] 05/25/2018 [phone]

## 2018-05-25 NOTE — Lactation Note (Signed)
This note was copied from a baby's chart. Lactation Consultation Note  Patient Name: Boy Marrianne MoodJimmi Gall RUEAV'WToday's Date: 05/25/2018   Mom has been offering breast with size 20 NS & then following up with a bottle. Mom may try to supplement at the breast, using a 5 Fr & syringe at the breast under the nipple shield (previously provided by another LC). I showed Mom where to cut end off of 5 Fr, if she chooses to use it. Mom also instructed to only push syringe when infant is sucking (and push syringe in small increments).   Mom is comfortable with pumping using size 24 flanges. Mom has a Medela Pump-in-Style at home. Mom knows to pump whenever infant receives formula.  [place]. [facility]   Mom denies any questions.    Lurline HareRichey, Dawan Farney [facility] 05/25/2018, 12:29 PM

## 2018-05-25 NOTE — Discharge Instructions (Signed)
Home Care Instructions for Mom  Breastfeeding Choosing to breastfeed is one of the best decisions you can make for yourself and your baby. A change in hormones during pregnancy causes your breasts to make breast milk in your milk-producing glands. Hormones prevent breast milk from being released before your baby is born. They also prompt milk flow after birth. Once breastfeeding has begun, thoughts of your baby, as well as his or her sucking or crying, can stimulate the release of milk from your milk-producing glands. Benefits of breastfeeding Research shows that breastfeeding offers many health benefits for infants and mothers. It also offers a cost-free and convenient way to feed your baby. For your baby  Your first milk (colostrum) helps your baby's digestive system to function better.  Special cells in your milk (antibodies) help your baby to fight off infections.  Breastfed babies are less likely to develop asthma, allergies, obesity, or type 2 diabetes. They are also at lower risk for sudden infant death syndrome (SIDS).  Nutrients in breast milk are better able to meet your babys needs compared to infant formula.  Breast milk improves your baby's brain development. For you  Breastfeeding helps to create a very special bond between you and your baby.  Breastfeeding is convenient. Breast milk costs nothing and is always available at the correct temperature.  Breastfeeding helps to burn calories. It helps you to lose the weight that you gained during pregnancy.  Breastfeeding makes your uterus return faster to its size before pregnancy. It also slows bleeding (lochia) after you give birth.  Breastfeeding helps to lower your risk of developing type 2 diabetes, osteoporosis, rheumatoid arthritis, cardiovascular disease, and breast, ovarian, uterine, and endometrial cancer later in life. Breastfeeding basics Starting breastfeeding  Find a comfortable place to sit or lie down, with  your neck and back well-supported.  Place a pillow or a rolled-up blanket under your baby to bring him or her to the level of your breast (if you are seated). Nursing pillows are specially designed to help support your arms and your baby while you breastfeed.  Make sure that your baby's tummy (abdomen) is facing your abdomen.  Gently massage your breast. With your fingertips, massage from the outer edges of your breast inward toward the nipple. This encourages milk flow. If your milk flows slowly, you may need to continue this action during the feeding.  Support your breast with 4 fingers underneath and your thumb above your nipple (make the letter "C" with your hand). Make sure your fingers are well away from your nipple and your babys mouth.  Stroke your baby's lips gently with your finger or nipple.  When your baby's mouth is open wide enough, quickly bring your baby to your breast, placing your entire nipple and as much of the areola as possible into your baby's mouth. The areola is the colored area around your nipple. ? More areola should be visible above your baby's upper lip than below the lower lip. ? Your baby's lips should be opened and extended outward (flanged) to ensure an adequate, comfortable latch. ? Your baby's tongue should be between his or her lower gum and your breast.  Make sure that your baby's mouth is correctly positioned around your nipple (latched). Your baby's lips should create a seal on your breast and be turned out (everted).  It is common for your baby to suck about 2-3 minutes in order to start the flow of breast milk. Latching Teaching your baby how to latch  onto your breast properly is very important. An improper latch can cause nipple pain, decreased milk supply, and poor weight gain in your baby. Also, if your baby is not latched onto your nipple properly, he or she may swallow some air during feeding. This can make your baby fussy. Burping your baby when  you switch breasts during the feeding can help to get rid of the air. However, teaching your baby to latch on properly is still the best way to prevent fussiness from swallowing air while breastfeeding. Signs that your baby has successfully latched onto your nipple  Silent tugging or silent sucking, without causing you pain. Infant's lips should be extended outward (flanged).  Swallowing heard between every 3-4 sucks once your milk has started to flow (after your let-down milk reflex occurs).  Muscle movement above and in front of his or her ears while sucking.  Signs that your baby has not successfully latched onto your nipple  Sucking sounds or smacking sounds from your baby while breastfeeding.  Nipple pain.  If you think your baby has not latched on correctly, slip your finger into the corner of your babys mouth to break the suction and place it between your baby's gums. Attempt to start breastfeeding again. Signs of successful breastfeeding Signs from your baby  Your baby will gradually decrease the number of sucks or will completely stop sucking.  Your baby will fall asleep.  Your baby's body will relax.  Your baby will retain a small amount of milk in his or her mouth.  Your baby will let go of your breast by himself or herself.  Signs from you  Breasts that have increased in firmness, weight, and size 1-3 hours after feeding.  Breasts that are softer immediately after breastfeeding.  Increased milk volume, as well as a change in milk consistency and color by the fifth day of breastfeeding.  Nipples that are not sore, cracked, or bleeding.  Signs that your baby is getting enough milk  Wetting at least 1-2 diapers during the first 24 hours after birth.  Wetting at least 5-6 diapers every 24 hours for the first week after birth. The urine should be clear or pale yellow by the age of 5 days.  Wetting 6-8 diapers every 24 hours as your baby continues to grow and  develop.  At least 3 stools in a 24-hour period by the age of 5 days. The stool should be soft and yellow.  At least 3 stools in a 24-hour period by the age of 7 days. The stool should be seedy and yellow.  No loss of weight greater than 10% of birth weight during the first 3 days of life.  Average weight gain of 4-7 oz (113-198 g) per week after the age of 4 days.  Consistent daily weight gain by the age of 5 days, without weight loss after the age of 2 weeks. After a feeding, your baby may spit up a small amount of milk. This is normal. Breastfeeding frequency and duration Frequent feeding will help you make more milk and can prevent sore nipples and extremely full breasts (breast engorgement). Breastfeed when you feel the need to reduce the fullness of your breasts or when your baby shows signs of hunger. This is called "breastfeeding on demand." Signs that your baby is hungry include:  Increased alertness, activity, or restlessness.  Movement of the head from side to side.  Opening of the mouth when the corner of the mouth or cheek is  stroked (rooting).  Increased sucking sounds, smacking lips, cooing, sighing, or squeaking.  Hand-to-mouth movements and sucking on fingers or hands.  Fussing or crying.  Avoid introducing a pacifier to your baby in the first 4-6 weeks after your baby is born. After this time, you may choose to use a pacifier. Research has shown that pacifier use during the first year of a baby's life decreases the risk of sudden infant death syndrome (SIDS). Allow your baby to feed on each breast as long as he or she wants. When your baby unlatches or falls asleep while feeding from the first breast, offer the second breast. Because newborns are often sleepy in the first few weeks of life, you may need to awaken your baby to get him or her to feed. Breastfeeding times will vary from baby to baby. However, the following rules can serve as a guide to help you make sure  that your baby is properly fed:  Newborns (babies 71 weeks of age or younger) may breastfeed every 1-3 hours.  Newborns should not go without breastfeeding for longer than 3 hours during the day or 5 hours during the night.  You should breastfeed your baby a minimum of 8 times in a 24-hour period.  Breast milk pumping Pumping and storing breast milk allows you to make sure that your baby is exclusively fed your breast milk, even at times when you are unable to breastfeed. This is especially important if you go back to work while you are still breastfeeding, or if you are not able to be present during feedings. Your lactation consultant can help you find a method of pumping that works best for you and give you guidelines about how long it is safe to store breast milk. Caring for your breasts while you breastfeed Nipples can become dry, cracked, and sore while breastfeeding. The following recommendations can help keep your breasts moisturized and healthy:  Avoid using soap on your nipples.  Wear a supportive bra designed especially for nursing. Avoid wearing underwire-style bras or extremely tight bras (sports bras).  Air-dry your nipples for 3-4 minutes after each feeding.  Use only cotton bra pads to absorb leaked breast milk. Leaking of breast milk between feedings is normal.  Use lanolin on your nipples after breastfeeding. Lanolin helps to maintain your skin's normal moisture barrier. Pure lanolin is not harmful (not toxic) to your baby. You may also hand express a few drops of breast milk and gently massage that milk into your nipples and allow the milk to air-dry.  In the first few weeks after giving birth, some women experience breast engorgement. Engorgement can make your breasts feel heavy, warm, and tender to the touch. Engorgement peaks within 3-5 days after you give birth. The following recommendations can help to ease engorgement:  Completely empty your breasts while breastfeeding  or pumping. You may want to start by applying warm, moist heat (in the shower or with warm, water-soaked hand towels) just before feeding or pumping. This increases circulation and helps the milk flow. If your baby does not completely empty your breasts while breastfeeding, pump any extra milk after he or she is finished.  Apply ice packs to your breasts immediately after breastfeeding or pumping, unless this is too uncomfortable for you. To do this: ? Put ice in a plastic bag. ? Place a towel between your skin and the bag. ? Leave the ice on for 20 minutes, 2-3 times a day.  Make sure that your baby is  latched on and positioned properly while breastfeeding.  If engorgement persists after 48 hours of following these recommendations, contact your health care provider or a Advertising copywriter. Overall health care recommendations while breastfeeding  Eat 3 healthy meals and 3 snacks every day. Well-nourished mothers who are breastfeeding need an additional 450-500 calories a day. You can meet this requirement by increasing the amount of a balanced diet that you eat.  Drink enough water to keep your urine pale yellow or clear.  Rest often, relax, and continue to take your prenatal vitamins to prevent fatigue, stress, and low vitamin and mineral levels in your body (nutrient deficiencies).  Do not use any products that contain nicotine or tobacco, such as cigarettes and e-cigarettes. Your baby may be harmed by chemicals from cigarettes that pass into breast milk and exposure to secondhand smoke. If you need help quitting, ask your health care provider.  Avoid alcohol.  Do not use illegal drugs or marijuana.  Talk with your health care provider before taking any medicines. These include over-the-counter and prescription medicines as well as vitamins and herbal supplements. Some medicines that may be harmful to your baby can pass through breast milk.  It is possible to become pregnant while  breastfeeding. If birth control is desired, ask your health care provider about options that will be safe while breastfeeding your baby. Where to find more information: [employer]: www.llli.org Contact a health care provider if:  You feel like you want to stop breastfeeding or have become frustrated with breastfeeding.  Your nipples are cracked or bleeding.  Your breasts are red, tender, or warm.  You have: ? Painful breasts or nipples. ? A swollen area on either breast. ? A fever or chills. ? Nausea or vomiting. ? Drainage other than breast milk from your nipples.  Your breasts do not become full before feedings by the fifth day after you give birth.  You feel sad and depressed.  Your baby is: ? Too sleepy to eat well. ? Having trouble sleeping. ? More than 38 week old and wetting fewer than 6 diapers in a 24-hour period. ? Not gaining weight by 7 days of age.  Your baby has fewer than 3 stools in a 24-hour period.  Your baby's skin or the white parts of his or her eyes become yellow. Get help right away if:  Your baby is overly tired (lethargic) and does not want to wake up and feed.  Your baby develops an unexplained fever. Summary  Breastfeeding offers many health benefits for infant and mothers.  Try to breastfeed your infant when he or she shows early signs of hunger.  Gently tickle or stroke your baby's lips with your finger or nipple to allow the baby to open his or her mouth. Bring the baby to your breast. Make sure that much of the areola is in your baby's mouth. Offer one side and burp the baby before you offer the other side.  Talk with your health care provider or lactation consultant if you have questions or you face problems as you breastfeed. This information is not intended to replace advice given to you by your health care provider. Make sure you discuss any questions you have with your health care provider. Document Released:  09/12/2005 Document Revised: 10/14/2016 Document Reviewed: 10/14/2016 Elsevier Interactive Patient Education  2018 [employer].  Breastfeeding Challenges and Solutions Even though breastfeeding is natural, it can be challenging, especially in the first few weeks after childbirth. It  is normal for problems to arise when starting to breastfeed your new baby, even if you have breastfed before. This document provides some solutions to the most common breastfeeding challenges. Challenges and solutions Challenge--Cracked or Sore Nipples Cracked or sore nipples are commonly experienced by breastfeeding mothers. Cracked or sore nipples often are caused by inadequate latching (when your baby's mouth attaches to your breast to breastfeed). Soreness can also happen if your baby is not positioned properly at your breast. Although nipple cracking and soreness are common during the first week after birth, nipple pain is never normal. If you experience nipple cracking or soreness that lasts longer than 1 week or nipple pain, call your health care provider or lactation consultant. Solution Ensure proper latching and positioning of your baby by following the steps below:  Find a comfortable place to sit or lie down, with your neck and back well supported.  Place a pillow or rolled up blanket under your baby to bring him or her to the level of your breast (if you are seated).  Make sure that your baby's abdomen is facing your abdomen.  Gently massage your breast. With your fingertips, massage from your chest wall toward your nipple in a circular motion. This encourages milk flow. You may need to continue this action during the feeding if your milk flows slowly.  Support your breast with 4 fingers underneath and your thumb above your nipple. Make sure your fingers are well away from your nipple and your babys mouth.  Stroke your baby's lips gently with your finger or nipple.  When your baby's mouth is open  wide enough, quickly bring your baby to your breast, placing your entire nipple and as much of the colored area around your nipple (areola) as possible into your baby's mouth. ? More areola should be visible above your baby's upper lip than below the lower lip. ? Your baby's tongue should be between his or her lower gum and your breast.  Ensure that your baby's mouth is correctly positioned around your nipple (latched). Your baby's lips should create a seal on your breast and be turned out (everted).  It is common for your baby to suck for about 2-3 minutes in order to start the flow of breast milk.  Signs that your baby has successfully latched on to your nipple include:  Quietly tugging or quietly sucking without causing you pain.  Swallowing heard between every 3-4 sucks.  Muscle movement above and in front of his or her ears with sucking.  Signs that your baby has not successfully latched on to nipple include:  Sucking sounds or smacking sounds from your baby while nursing.  Nipple pain.  Ensure that your breasts stay moisturized and healthy by:  Avoiding the use of soap on your nipples.  Wearing a supportive bra. Avoid wearing underwire-style bras or tight bras.  Air drying your nipples for 3-4 minutes after each feeding.  Using only cotton bra pads to absorb breast milk leakage. Leaking of breast milk between feedings is normal. Be sure to change the pads if they become soaked with milk.  Using lanolin on your nipples after nursing. Lanolin helps to maintain your skin's normal moisture barrier. If you use pure lanolin you do not need to wash it off before feeding your baby again. Pure lanolin is not toxic to your baby. You may also hand express a few drops of breast milk and gently massage that milk into your nipples, allowing it to air dry.  Challenge--Breast Engorgement Breast engorgement is the overfilling of your breasts with breast milk. In the first few weeks after  giving birth, you may experience breast engorgement. Breast engorgement can make your breasts throb and feel hard, tightly stretched, warm, and tender. Engorgement peaks about the fifth day after you give birth. Having breast engorgement does not mean you have to stop breastfeeding your baby. Solution  Breastfeed when you feel the need to reduce the fullness of your breasts or when your baby shows signs of hunger. This is called "breastfeeding on demand."  Newborns (babies younger than 4 weeks) often breastfeed every 1-3 hours during the day. You may need to awaken your baby to feed if he or she is asleep at a feeding time.  Do not allow your baby to sleep longer than 5 hours during the night without a feeding.  Pump or hand express breast milk before breastfeeding to soften your breast, areola, and nipple.  Apply warm, moist heat (in the shower or with warm water-soaked hand towels) just before feeding or pumping, or massage your breast before or during breastfeeding. This increases circulation and helps your milk to flow.  Completely empty your breasts when breastfeeding or pumping. Afterward, wear a snug bra (nursing or regular) or tank top for 1-2 days to signal your body to slightly decrease milk production. Only wear snug bras or tank tops to treat engorgement. Tight bras typically should be avoided by breastfeeding mothers. Once engorgement is relieved, return to wearing regular, loose-fitting clothes.  Apply ice packs to your breasts to lessen the pain from engorgement and relieve swelling, unless the ice is uncomfortable for you.  Do not delay feedings. Try to relax when it is time to feed your baby. This helps to trigger your "let-down reflex," which releases milk from your breast.  Ensure your baby is latched on to your breast and positioned properly while breastfeeding.  Allow your baby to remain at your breast as long as he or she is latched on well and actively sucking. Your baby  will let you know when he or she is done breastfeeding by pulling away from your breast or falling asleep.  Avoid introducing bottles or pacifiers to your baby in the early weeks of breastfeeding. Wait to introduce these things until after resolving any breastfeeding challenges.  Try to pump your milk on the same schedule as when your baby would breastfeed if you are returning to work or away from home for an extended period.  Drink plenty of fluids to avoid dehydration, which can eventually put you at greater risk of breast engorgement.  If you follow these suggestions, your engorgement should improve in 24-48 hours. If you are still experiencing difficulty, call your lactation consultant or health care provider. Challenge--Plugged Milk Ducts Plugged milk ducts occur when the duct does not drain milk effectively and becomes swollen. Wearing a tight-fitting nursing bra or having difficulty with latching may cause plugged milk ducts. Not drinking enough water (8-10 c [1.9-2.4 L] per day) can contribute to plugged milk ducts. Once a duct has become plugged, hard lumps, soreness, and redness may develop in your breast. Solution Do not delay feedings. Feed your baby frequently and try to empty your breasts of milk at each feeding. Try breastfeeding from the affected side first so there is a better chance that the milk will drain completely from that breast. Apply warm, moist towels to your breasts for 5-10 minutes before feeding. Alternatively, a hot shower right before breastfeeding can  provide the moist heat that can encourage milk flow. Gentle massage of the sore area before and during a feeding may also help. Avoid wearing tight clothing or bras that put pressure on your breasts. Wear bras that offer good support to your breasts, but avoid underwire bras. If you have a plugged milk duct and develop a fever, you need to see your health care provider. Challenge--Mastitis Mastitis is inflammation of your  breast. It usually is caused by a bacterial infection and can cause flu-like symptoms. You may develop redness in your breast and a fever. Often when mastitis occurs, your breast becomes firm, warm, and very painful. The most common causes of mastitis are poor latching, ineffective sucking from your baby, consistent pressure on your breast (possibly from wearing a tight-fitting bra or shirt that restricts the milk flow), unusual stress or fatigue, or missed feedings. Solution You will be given antibiotic medicine to treat the infection. It is still important to breastfeed frequently to empty your breasts. Continuing to breastfeed while you recover from mastitis will not harm your baby. Make sure your baby is positioned properly during every feeding. Apply moist heat to your breasts for a few minutes before feeding to help the milk flow and to help your breasts empty more easily. Challenge--Thrush Ginette Pitman is a yeast infection that can form on your nipples, in your breast, or in your baby's mouth. It causes itching, soreness, burning or stabbing pain, and sometimes a rash. Solution You will be given a medicated ointment for your nipples, and your baby will be given a liquid medicine for his or her mouth. It is important that you and your baby are treated at the same time because thrush can be passed between you and your baby. Change disposable nursing pads often. Any bras, towels, or clothing that come in contact with infected areas of your body or your baby's body need to be washed in very hot water every day. Wash your hands and your baby's hands often. All pacifiers, bottle nipples, or toys your baby puts in his or her mouth should be boiled once a day for 20 minutes. After 1 week of treatment, discard pacifiers and bottle nipples and buy new ones. All breast pump parts that touch the milk need to be boiled for 20 minutes every day. Challenge--Low Milk Supply You may not be producing enough milk if your baby  is not gaining the proper amount of weight. Breast milk production is based on a supply-and-demand system. Your milk supply depends on how frequently and effectively your baby empties your breast. Solution The more you breastfeed and pump, the more breast milk you will produce. It is important that your baby empties at least one of your breasts at each feeding. If this is not happening, then use a breast pump or hand express any milk that remains. This will help to drain as much milk as possible at each feeding. It will also signal your body to produce more milk. If your baby is not emptying your breasts, it may be due to latching, sucking, or positioning problems. If low milk supply continues after addressing these issues, contact your health care provider or a lactation specialist as soon as possible. Challenge--Inverted or Flat Nipples Some women have nipples that turn inward instead of protruding outward. Other women have nipples that are flat. Inverted or flat nipples can sometimes make it more difficult for your baby to latch onto your breast. Solution You may be given a small device that  pulls out inverted nipples. This device should be applied right before your baby is brought to your breast. You can also try using a breast pump for a short time before placing the baby at your breast. The pump can pull your nipple outwards to help your infant latch more easily. The baby's sucking motion will help the inverted nipple protrude as well. If you have flat nipples, encourage your baby to latch onto your breast and feed frequently in the early days after birth. This will give your baby practice latching on correctly while your breast is still soft. When your milk supply increases, between the second and fifth day after birth and your breasts become full, your baby will have an easier time latching. Contact a lactation consultant if you still have concerns. She or he can teach you additional techniques to  address breastfeeding problems related to nipple shape and position. Where to find more information: [employer]: www.llli.org This information is not intended to replace advice given to you by your health care provider. Make sure you discuss any questions you have with your health care provider. Document Released: 03/06/2006 Document Revised: 02/24/2016 Document Reviewed: 03/08/2013 Elsevier Interactive Patient Education  2017 [employer] ACTIVITY  Gradually return to your regular activities.  Let yourself rest. Nap while your baby sleeps.  Avoid lifting anything that is heavier than 10 lb (4.5 kg) until your health care provider says it is okay.  Avoid activities that take a lot of effort and energy (are strenuous) until approved by your health care provider. Walking at a slow-to-moderate pace is usually safe.  If you had a cesarean delivery: ? Do not vacuum, climb stairs, or drive a car for 4-6 weeks. ? Have someone help you at home until you feel like you can do your usual activities yourself. ? Do exercises as told by your health care provider, if this applies.  VAGINAL BLEEDING You may continue to bleed for 4-6 weeks after delivery. Over time, the amount of blood usually decreases and the color of the blood usually gets lighter. However, the flow of bright red blood may increase if you have been too active. If you need to use more than one pad in an hour because your pad gets soaked, or if you pass a large clot:  Lie down.  Raise your feet.  Place a cold compress on your lower abdomen.  Rest.  Call your health care provider.  If you are breastfeeding, your period should return anytime between 8 weeks after delivery and the time that you stop breastfeeding. If you are not breastfeeding, your period should return 6-8 weeks after delivery. PERINEAL CARE The perineal area, or perineum, is the part of your body between your thighs. After delivery, this area  needs special care. Follow these instructions as told by your health care provider.  Take warm tub baths for 15-20 minutes.  Use medicated pads and pain-relieving sprays and creams as told.  Do not use tampons or douches until vaginal bleeding has stopped.  Each time you go to the bathroom: ? Use a peri bottle. ? Change your pad. ? Use towelettes in place of toilet paper until your stitches have healed.  Do Kegel exercises every day. Kegel exercises help to maintain the muscles that support the vagina, bladder, and bowels. You can do these exercises while you are standing, sitting, or lying down. To do Kegel exercises: ? Tighten the muscles of your abdomen and the muscles that surround your  birth canal. ? Hold for a few seconds. ? Relax. ? Repeat until you have done this 5 times in a row.  To prevent hemorrhoids from developing or getting worse: ? Drink enough fluid to keep your urine clear or pale yellow. ? Avoid straining when having a bowel movement. ? Take over-the-counter medicines and stool softeners as told by your health care provider.  BREAST CARE  Wear a tight-fitting bra.  Avoid taking over-the-counter pain medicine for breast discomfort.  Apply ice to the breasts to help with discomfort as needed: ? Put ice in a plastic bag. ? Place a towel between your skin and the bag. ? Leave the ice on for 20 minutes or as told by your health care provider.  NUTRITION  Eat a well-balanced diet.  Do not try to lose weight quickly by cutting back on calories.  Take your prenatal vitamins until your postpartum checkup or until your health care provider tells you to stop.  POSTPARTUM DEPRESSION You may find yourself crying for no apparent reason and unable to cope with all of the changes that come with having a newborn. This mood is called postpartum depression. Postpartum depression happens because your hormone levels change after delivery. If you have postpartum depression,  get support from your partner, friends, and family. If the depression does not go away on its own after several weeks, contact your health care provider. BREAST SELF-EXAM Do a breast self-exam each month, at the same time of the month. If you are breastfeeding, check your breasts just after a feeding, when your breasts are less full. If you are breastfeeding and your period has started, check your breasts on day 5, 6, or 7 of your period. Report any lumps, bumps, or discharge to your health care provider. Know that breasts are normally lumpy if you are breastfeeding. This is temporary, and it is not a health risk. INTIMACY AND SEXUALITY Avoid sexual activity for at least 3-4 weeks after delivery or until the brownish-red vaginal flow is completely gone. If you want to avoid pregnancy, use some form of birth control. You can get pregnant after delivery, even if you have not had your period. SEEK MEDICAL CARE IF:  You feel unable to cope with the changes that a child brings to your life, and these feelings do not go away after several weeks.  You notice a lump, a bump, or discharge on your breast.  SEEK IMMEDIATE MEDICAL CARE IF:  Blood soaks your pad in 1 hour or less.  You have: ? Severe pain or cramping in your lower abdomen. ? A bad-smelling vaginal discharge. ? A fever that is not controlled by medicine. ? A fever, and an area of your breast is red and sore. ? Pain or redness in your calf. ? Sudden, severe chest pain. ? Shortness of breath. ? Painful or bloody urination. ? Problems with your vision.  You vomit for 12 hours or longer.  You develop a severe headache.  You have serious thoughts about hurting yourself, your child, or anyone else.  This information is not intended to replace advice given to you by your health care provider. Make sure you discuss any questions you have with your health care provider. Document Released: 09/09/2000 Document Revised: 02/18/2016 Document  Reviewed: 03/16/2015 Elsevier Interactive Patient Education  2017 Elsevier Inc. Postpartum Care After Cesarean Delivery The period of time right after you deliver your newborn is called the postpartum period. What kind of medical care will I receive?  You may continue to receive fluids and medicines through an IV tube inserted into one of your veins.  You may have small, flexible tube (catheter) draining urine from your bladder into a bag outside of your body. The catheter will be removed as soon as possible.  You may be given a squirt bottle to use when you go to the bathroom. You may use this until you are comfortable wiping as usual. To use the squirt bottle, follow these steps: ? Before you urinate, fill the squirt bottle with warm water. The water should be warm. Do not use hot water. ? After you urinate, while you are sitting on the toilet, use the squirt bottle to rinse the area around your urethra and vaginal opening. This rinses away any urine and blood. ? You may do this instead of wiping. As you start healing, you may use the squirt bottle before wiping yourself. Make sure to wipe gently. ? Fill the squirt bottle with clean water every time you use the bathroom.  You will be given sanitary pads to wear.  Your incision will be monitored to make sure it is healing properly. You will be told when it is safe for your stitches, staples, or skin adhesive tape to be removed. What can I expect?  You may not feel the need to urinate for several hours after delivery.  You will have some soreness and pain in your abdomen. You may have a small amount of blood or clear fluid coming from your incision.  If you are breastfeeding, you may have uterine contractions every time you breastfeed for up to several weeks postpartum. Uterine contractions help your uterus return to its normal size.  It is normal to have vaginal bleeding (lochia) after delivery. The amount and appearance of lochia is  often similar to a menstrual period in the first week after delivery. It will gradually decrease over the next few weeks to a dry, yellow-brown discharge. For most women, lochia stops completely by 6-8 weeks after delivery. Vaginal bleeding can vary from woman to woman.  Within the first few days after delivery, you may have breast engorgement. This is when your breasts feel heavy, full, and uncomfortable. Your breasts may also throb and feel hard, tightly stretched, warm, and tender. After this occurs, you may have milk leaking from your breasts.Your health care provider can help you relieve discomfort due to breast engorgement. Breast engorgement should go away within a few days.  You may feel more sad or worried than normal due to hormonal changes after delivery. These feelings should not last more than a few days. If these feelings do not go away after several days, speak with your health care provider. How should I care for myself?  Tell your health care provider if you have pain or discomfort.  Drink enough water to keep your urine clear or pale yellow.  Wash your hands thoroughly with soap and water for at least 20 seconds after changing your sanitary pads or using the toilet, and before holding or feeding your baby.  If you are not breastfeeding, avoid touching your breasts a lot. Doing this can make your breasts produce more milk.  If you become weak or lightheaded, or you feel like you might faint, ask for help before: ? Getting out of bed. ? Showering.  Change your sanitary pads frequently. Watch for any changes in your flow, such as a sudden increase in volume, a change in color, or the passing of large blood  clots. If you pass a blood clot from your vagina, save it to show to your health care provider. Do not flush blood clots down the toilet without having your health care provider look at them.  Make sure that all your vaccinations are up to date. This can help protect you and  your baby from getting certain diseases. You may need to have immunizations done before you leave the hospital.  If desired, talk with your health care provider about methods of family planning or birth control (contraception). How can I start bonding with my baby? Spending as much time as possible with your baby is very important. During this time, you and your baby can get to know each other and develop a bond. Having your baby stay with you in your room (rooming in) can give you time to get to know your baby. Rooming in can also help you become comfortable caring for your baby. Breastfeeding can also help you bond with your baby. How can I plan for returning home with my baby?  Make sure that you have a car seat installed in your vehicle. ? Your car seat should be checked by a certified car seat installer to make sure that it is installed safely. ? Make sure that your baby fits into the car seat safely.  Ask your health care provider any questions you have about caring for yourself or your baby. Make sure that you are able to contact your health care provider with any questions after leaving the hospital. This information is not intended to replace advice given to you by your health care provider. Make sure you discuss any questions you have with your health care provider. Document Released: 06/06/2012 Document Revised: 02/15/2016 Document Reviewed: 08/17/2015 Elsevier Interactive Patient Education  2018 [employer]. Postpartum Depression and Baby Blues The postpartum period begins right after the birth of a baby. During this time, there is often a great amount of joy and excitement. It is also a time of many changes in the life of the parents. Regardless of how many times a mother gives birth, each child brings new challenges and dynamics to the family. It is not unusual to have feelings of excitement along with confusing shifts in moods, emotions, and thoughts. All mothers are at risk of  developing postpartum depression or the "baby blues." These mood changes can occur right after giving birth, or they may occur many months after giving birth. The baby blues or postpartum depression can be mild or severe. Additionally, postpartum depression can go away rather quickly, or it can be a long-term condition. What are the causes? Raised hormone levels and the rapid drop in those levels are thought to be a main cause of postpartum depression and the baby blues. A number of hormones change during and after pregnancy. Estrogen and progesterone usually decrease right after the delivery of your baby. The levels of thyroid hormone and various cortisol steroids also rapidly drop. Other factors that play a role in these mood changes include major life events and genetics. What increases the risk? If you have any of the following risks for the baby blues or postpartum depression, know what symptoms to watch out for during the postpartum period. Risk factors that may increase the likelihood of getting the baby blues or postpartum depression include:  Having a personal or family history of depression.  Having depression while being pregnant.  Having premenstrual mood issues or mood issues related to oral contraceptives.  Having a lot of  life stress.  Having marital conflict.  Lacking a social support network.  Having a baby with special needs.  Having health problems, such as diabetes.  What are the signs or symptoms? Symptoms of baby blues include:  Brief changes in mood, such as going from extreme happiness to sadness.  Decreased concentration.  Difficulty sleeping.  Crying spells, tearfulness.  Irritability.  Anxiety.  Symptoms of postpartum depression typically begin within the first month after giving birth. These symptoms include:  Difficulty sleeping or excessive sleepiness.  Marked weight loss.  Agitation.  Feelings of worthlessness.  Lack of interest in  activity or food.  Postpartum psychosis is a very serious condition and can be dangerous. Fortunately, it is rare. Displaying any of the following symptoms is cause for immediate medical attention. Symptoms of postpartum psychosis include:  Hallucinations and delusions.  Bizarre or disorganized behavior.  Confusion or disorientation.  How is this diagnosed? A diagnosis is made by an evaluation of your symptoms. There are no medical or lab tests that lead to a diagnosis, but there are various questionnaires that a health care provider may use to identify those with the baby blues, postpartum depression, or psychosis. Often, a screening tool called the [place] Postnatal Depression Scale is used to diagnose depression in the postpartum period. How is this treated? The baby blues usually goes away on its own in 1-2 weeks. Social support is often all that is needed. You will be encouraged to get adequate sleep and rest. Occasionally, you may be given medicines to help you sleep. Postpartum depression requires treatment because it can last several months or longer if it is not treated. Treatment may include individual or group therapy, medicine, or both to address any social, physiological, and psychological factors that may play a role in the depression. Regular exercise, a healthy diet, rest, and social support may also be strongly recommended. Postpartum psychosis is more serious and needs treatment right away. Hospitalization is often needed. Follow these instructions at home:  Get as much rest as you can. Nap when the baby sleeps.  Exercise regularly. Some women find yoga and walking to be beneficial.  Eat a balanced and nourishing diet.  Do little things that you enjoy. Have a cup of tea, take a bubble bath, read your favorite magazine, or listen to your favorite music.  Avoid alcohol.  Ask for help with household chores, cooking, grocery shopping, or running errands as needed. Do not  try to do everything.  Talk to people close to you about how you are feeling. Get support from your partner, family members, friends, or other new moms.  Try to stay positive in how you think. Think about the things you are grateful for.  Do not spend a lot of time alone.  Only take over-the-counter or prescription medicine as directed by your health care provider.  Keep all your postpartum appointments.  Let your health care provider know if you have any concerns. Contact a health care provider if: You are having a reaction to or problems with your medicine. Get help right away if:  You have suicidal feelings.  You think you may harm the baby or someone else. This information is not intended to replace advice given to you by your health care provider. Make sure you discuss any questions you have with your health care provider. Document Released: 06/16/2004 Document Revised: 02/18/2016 Document Reviewed: 06/24/2013 Elsevier Interactive Patient Education  2017 [employer].

## 2018-06-27 DIAGNOSIS — Z3482 Encounter for supervision of other normal pregnancy, second trimester: Secondary | ICD-10-CM | POA: Diagnosis not present

## 2018-06-27 DIAGNOSIS — Z3483 Encounter for supervision of other normal pregnancy, third trimester: Secondary | ICD-10-CM | POA: Diagnosis not present

## 2018-11-15 LAB — OB RESULTS CONSOLE ABO/RH: RH Type: POSITIVE

## 2018-11-15 LAB — OB RESULTS CONSOLE RPR: RPR: NONREACTIVE

## 2018-11-15 LAB — OB RESULTS CONSOLE GC/CHLAMYDIA
Chlamydia: NEGATIVE
Gonorrhea: NEGATIVE

## 2018-11-15 LAB — OB RESULTS CONSOLE RUBELLA ANTIBODY, IGM: Rubella: NON-IMMUNE/NOT IMMUNE

## 2018-11-15 LAB — OB RESULTS CONSOLE HEPATITIS B SURFACE ANTIGEN: Hepatitis B Surface Ag: NEGATIVE

## 2018-11-15 LAB — OB RESULTS CONSOLE ANTIBODY SCREEN: Antibody Screen: NEGATIVE

## 2018-11-15 LAB — OB RESULTS CONSOLE HIV ANTIBODY (ROUTINE TESTING): HIV: NONREACTIVE

## 2018-11-21 ENCOUNTER — Emergency Department: Payer: 59

## 2018-11-21 ENCOUNTER — Other Ambulatory Visit: Payer: Self-pay

## 2018-11-21 ENCOUNTER — Emergency Department
Admission: EM | Admit: 2018-11-21 | Discharge: 2018-11-21 | Disposition: A | Discharge status: Home / Self Care | Payer: 59 | Attending: Emergency Medicine | Admitting: Emergency Medicine

## 2018-11-21 ENCOUNTER — Encounter: Payer: Self-pay | Admitting: Emergency Medicine

## 2018-11-21 DIAGNOSIS — E860 Dehydration: Secondary | ICD-10-CM | POA: Insufficient documentation

## 2018-11-21 DIAGNOSIS — R1110 Vomiting, unspecified: Secondary | ICD-10-CM | POA: Diagnosis not present

## 2018-11-21 DIAGNOSIS — Z91010 Allergy to peanuts: Secondary | ICD-10-CM | POA: Diagnosis not present

## 2018-11-21 DIAGNOSIS — J40 Bronchitis, not specified as acute or chronic: Secondary | ICD-10-CM | POA: Diagnosis not present

## 2018-11-21 DIAGNOSIS — R0602 Shortness of breath: Secondary | ICD-10-CM | POA: Diagnosis present

## 2018-11-21 LAB — BASIC METABOLIC PANEL
Anion gap: 13 (ref 5–15)
BUN: 8 mg/dL (ref 6–20)
CHLORIDE: 104 mmol/L (ref 98–111)
CO2: 19 mmol/L — ABNORMAL LOW (ref 22–32)
CREATININE: 0.57 mg/dL (ref 0.44–1.00)
Calcium: 9.3 mg/dL (ref 8.9–10.3)
GFR calc Af Amer: >60 mL/min (ref >60)
Glucose, Bld: 140 mg/dL — ABNORMAL HIGH (ref 70–99)
Potassium: 3.2 mmol/L — ABNORMAL LOW (ref 3.5–5.1)
Sodium: 136 mmol/L (ref 135–145)

## 2018-11-21 LAB — INFLUENZA PANEL BY PCR (TYPE A & B)
INFLAPCR: NEGATIVE
Influenza B By PCR: NEGATIVE

## 2018-11-21 LAB — CBC
HCT: 41.5 % (ref 36.0–46.0)
Hemoglobin: 13.6 g/dL (ref 12.0–15.0)
MCH: 25.2 pg — AB (ref 26.0–34.0)
MCHC: 32.8 g/dL (ref 30.0–36.0)
MCV: 77 fL — ABNORMAL LOW (ref 80.0–100.0)
PLATELETS: 243 10*3/uL (ref 150–400)
RBC: 5.39 MIL/uL — ABNORMAL HIGH (ref 3.87–5.11)
RDW: 15.3 % (ref 11.5–15.5)
WBC: 12.3 10*3/uL — ABNORMAL HIGH (ref 4.0–10.5)
nRBC: 0 % (ref 0.0–0.2)

## 2018-11-21 LAB — TROPONIN I: Troponin I: <0.03 ng/mL (ref <0.03)

## 2018-11-21 MED ORDER — ONDANSETRON 4 MG PO TBDP: 4.0000 mg | ORAL_TABLET | Freq: Three times a day (TID) | ORAL | 0 refills | Status: DC | PRN

## 2018-11-21 MED ORDER — ALBUTEROL SULFATE HFA 108 (90 BASE) MCG/ACT IN AERS: 2.0000 | INHALATION_SPRAY | Freq: Four times a day (QID) | RESPIRATORY_TRACT | 2 refills | Status: DC | PRN

## 2018-11-21 MED ORDER — AZITHROMYCIN 250 MG PO TABS: ORAL_TABLET | ORAL | 0 refills | Status: DC

## 2018-11-21 MED ORDER — PREDNISONE 50 MG PO TABS: ORAL_TABLET | ORAL | 0 refills | Status: DC

## 2018-11-21 MED ORDER — SODIUM CHLORIDE 0.9 % IV SOLN
Freq: Once | INTRAVENOUS | Status: AC
  Administered 2018-11-21: 16:00:00 via INTRAVENOUS

## 2018-11-21 MED ORDER — METHYLPREDNISOLONE SODIUM SUCC 125 MG IJ SOLR
125.0000 mg | Freq: Once | INTRAMUSCULAR | Status: AC
  Administered 2018-11-21: 125 mg via INTRAVENOUS
  Filled 2018-11-21: qty 2

## 2018-11-21 MED ORDER — ONDANSETRON HCL 4 MG/2ML IJ SOLN
4.0000 mg | Freq: Once | INTRAMUSCULAR | Status: AC
  Administered 2018-11-21: 4 mg via INTRAVENOUS
  Filled 2018-11-21: qty 2

## 2018-11-21 MED ORDER — ALBUTEROL SULFATE (2.5 MG/3ML) 0.083% IN NEBU
5.0000 mg | INHALATION_SOLUTION | Freq: Once | RESPIRATORY_TRACT | Status: AC
  Administered 2018-11-21: 5 mg via RESPIRATORY_TRACT
  Filled 2018-11-21: qty 6

## 2018-11-21 NOTE — ED Triage Notes (Signed)
Pt in via POV, sent over from [facility]; complaints of acute onset shortness of breath upon waking this morning, denies any chest pain.  Pt also reports severe N/V over the last two days, states she has been unable to keep anything down.  Pt tachycardic upon arrival, other vitals WDL.  Pt is [redacted] weeks pregnant, denies any concerns w/ this pregnancy.

## 2018-11-21 NOTE — ED Notes (Signed)
Pt alert and oriented X4, active, cooperative, pt in NAD. RR even and unlabored, color WNL.  Pt informed to return if any life threatening symptoms occur.  Discharge and followup instructions reviewed. Ambulates safely. 

## 2018-11-21 NOTE — ED Provider Notes (Signed)
[facility] Emergency Department Provider Note       Time seen: ----------------------------------------- 3:18 PM on 11/21/2018 -----------------------------------------   I have reviewed the triage vital signs and the nursing notes.  HISTORY   Chief Complaint Shortness of Breath    HPI Gabrielle Hughes is a 28 y.o. female with a history of gestational diabetes who presents to the ED for shortness of breath upon awakening this morning.  She denies any chest pain, reports severe nausea vomiting at the past 2 days and being unable to keep anything down.  She was noted to be tachycardic on arrival.  Patient states she is around [redacted] weeks pregnant, denies any vaginal bleeding or discharge.  Patient received 2 breathing treatments at the urgent care before being sent here.  She received another breathing treatment prior to my evaluation.  Patient describes no pleuritic chest pain  Past Medical History:  Diagnosis Date  . Gestational diabetes   . Pregnancy induced hypertension     Patient Active Problem List   Diagnosis Date Noted  . PP C/S (8/28) 05/24/2018  . Encounter for planned induction of labor 05/23/2018  . Abnormal glucose tolerance test (GTT) during pregnancy, antepartum 04/06/2018    Past Surgical History:  Procedure Laterality Date  . BUNIONECTOMY     rt and left  . CESAREAN SECTION N/A 05/23/2018   Procedure: CESAREAN SECTION;  Surgeon: Olivia Mackie, MD;  Location: [facility] BIRTHING SUITES;  Service: Obstetrics;  Laterality: N/A;    Allergies Peanut-containing drug products  Social History Social History   Tobacco Use  . Smoking status: Never Smoker  . Smokeless tobacco: Never Used  Substance Use Topics  . Alcohol use: Not Currently  . Drug use: Never   Review of Systems Constitutional: Negative for fever. Cardiovascular: Negative for chest pain. Respiratory: Positive for shortness of breath, cough Gastrointestinal: Negative for  abdominal pain, positive for nausea and vomiting Musculoskeletal: Negative for back pain. Skin: Negative for rash. Neurological: Negative for headaches, focal weakness or numbness.  All systems negative/normal/unremarkable except as stated in the HPI  ____________________________________________   PHYSICAL EXAM:  VITAL SIGNS: ED Triage Vitals  Enc Vitals Group     BP 11/21/18 1324 134/75     Pulse Rate 11/21/18 1324 (!) 124     Resp 11/21/18 1324 20     Temp --      Temp Source 11/21/18 1324 Oral     SpO2 11/21/18 1324 98 %     Weight 11/21/18 1325 215 lb (97.5 kg)     Height 11/21/18 1325 5\' 9"  (1.753 m)     Head Circumference --      Peak Flow --      Pain Score 11/21/18 1325 0     Pain Loc --      Pain Edu? --      Excl. in GC? --    Constitutional: Alert and oriented. Well appearing and in no distress. Eyes: Conjunctivae are normal. Normal extraocular movements. ENT      Head: Normocephalic and atraumatic.      Nose: No congestion/rhinnorhea.      Mouth/Throat: Mucous membranes are moist.      Neck: No stridor. Cardiovascular: Rapid rate, regular rhythm. No murmurs, rubs, or gallops. Respiratory: Bilateral wheezing and rhonchi are noted Gastrointestinal: Soft and nontender. Normal bowel sounds Musculoskeletal: Nontender with normal range of motion in extremities. No lower extremity tenderness nor edema. Neurologic:  Normal speech and language. No gross focal neurologic deficits  are appreciated.  Skin:  Skin is warm, dry and intact. No rash noted. Psychiatric: Mood and affect are normal. Speech and behavior are normal.  ____________________________________________  EKG: Interpreted by me.  Sinus tachycardia with a rate of 136 bpm, normal PR interval, normal QRS, nonspecific ST and T wave abnormalities  ____________________________________________  ED COURSE:  As part of my medical decision making, I reviewed the following data within the electronic medical  record:  History obtained from family if available, nursing notes, old chart and ekg, as well as notes from prior ED visits. Patient presented for shortness of breath, we will assess with labs and imaging as indicated at this time.   Procedures ____________________________________________   LABS (pertinent positives/negatives)  Labs Reviewed  CBC - Abnormal; Notable for the following components:      Result Value   WBC 12.3 (*)    RBC 5.39 (*)    MCV 77.0 (*)    MCH 25.2 (*)    All other components within normal limits  BASIC METABOLIC PANEL - Abnormal; Notable for the following components:   Potassium 3.2 (*)    CO2 19 (*)    Glucose, Bld 140 (*)    All other components within normal limits  INFLUENZA PANEL BY PCR (TYPE A & B)  TROPONIN I    RADIOLOGY Images were viewed by me  Chest x-ray does not reveal any acute process  ____________________________________________   DIFFERENTIAL DIAGNOSIS   Anxiety, dehydration, hyperemesis, electrolyte abnormality, PE  FINAL ASSESSMENT AND PLAN  Bronchitis, vomiting, dehydration   Plan: The patient had presented for acute onset shortness of breath upon awakening this morning. Patient's labs did not reveal any acute process and were reassuring. Patient's imaging was negative as well.  She did have significant wheezing here in the ER which improved with breathing treatments and steroids.  We will get a treat her with albuterol, steroids and a Z-Pak.  She will also be prescribed Zofran ODT.  Bedside ultrasound revealed normal intrauterine pregnancy with normal cardiac activity.  Patient's EKG is abnormal of uncertain etiology.  She does not have any pleuritic pain to suggest a PE.  I will advise close outpatient follow-up.   Ulice Dash, MD    Note: This note was generated in part or whole with voice recognition software. Voice recognition is usually quite accurate but there are transcription errors that can and very often do  occur. I apologize for any typographical errors that were not detected and corrected.     Emily Filbert, MD 11/21/18 [id number]

## 2018-11-21 NOTE — ED Notes (Signed)
Unable to obtain FHT. Will notify MD.

## 2018-11-21 NOTE — ED Notes (Signed)
Lab notified to add on Troponin. 

## 2018-11-21 NOTE — ED Notes (Signed)
Patient transported to X-ray 

## 2019-05-03 ENCOUNTER — Other Ambulatory Visit: Payer: Self-pay | Admitting: Obstetrics and Gynecology

## 2019-05-07 LAB — OB RESULTS CONSOLE GBS: GBS: POSITIVE

## 2019-05-13 ENCOUNTER — Other Ambulatory Visit: Payer: Self-pay

## 2019-05-13 ENCOUNTER — Inpatient Hospital Stay (HOSPITAL_COMMUNITY): Admission: AD | Admit: 2019-05-13 | Payer: 59 | Source: Home / Self Care | Admitting: Obstetrics and Gynecology

## 2019-05-13 ENCOUNTER — Encounter (HOSPITAL_COMMUNITY): Payer: Self-pay | Admitting: *Deleted

## 2019-05-13 ENCOUNTER — Inpatient Hospital Stay (HOSPITAL_COMMUNITY)
Admission: AD | Admit: 2019-05-13 | Discharge: 2019-05-13 | Disposition: A | Discharge status: Home / Self Care | Payer: 59 | Attending: Obstetrics and Gynecology | Admitting: Obstetrics and Gynecology

## 2019-05-13 DIAGNOSIS — Z3A37 37 weeks gestation of pregnancy: Secondary | ICD-10-CM

## 2019-05-13 DIAGNOSIS — O471 False labor at or after 37 completed weeks of gestation: Secondary | ICD-10-CM

## 2019-05-13 DIAGNOSIS — Z3689 Encounter for other specified antenatal screening: Secondary | ICD-10-CM

## 2019-05-13 DIAGNOSIS — O26893 Other specified pregnancy related conditions, third trimester: Secondary | ICD-10-CM | POA: Diagnosis not present

## 2019-05-13 LAB — AMNISURE RUPTURE OF MEMBRANE (ROM) NOT AT ARMC: Amnisure ROM: NEGATIVE

## 2019-05-13 NOTE — MAU Provider Note (Signed)
Gabrielle Hughes 28 y.o. [redacted]w[redacted]d Has been leaking for one week.  Reports she was seen in the office for 3 days in a row to evaluate for ROM.  Leaking has continued and today she comes to MAU for contractions also.  Plans Repeat C/S and BTL.  Baby is moving well.  No vaginal bleeding. RN checked cervix and did not note any leaking of fluid.  Vitals:   05/13/19 1826  BP: 137/76  Pulse: 99  Resp: 16  Temp: 97.7 F (36.5 C)  SpO2: 99%   Medical screen done.  Client is stable. GENERAL: Well-developed, well-nourished female in no acute distress.  HEENT: Normocephalic, atraumatic.  LUNGS: Effort normal  HEART: Regular rate  SKIN: Warm, dry and without erythema  PSYCH: Normal mood and affect  NST: Baseline: 150 BPM with moderate variability Accels: 15x15 noted Contractions 4-6 minutes Decelerations none noted Reactive NST  Will do amnisure to determine if ROM.  Cervical exam by RN was similar to cervical exam.  Dr. Ronita Hipps wanted client rechecked and will determine whether to admit.  Assessment: Reactive NST Anmisure negative Cervix unchanged while in MAU observation and unchanged from visit in the office earlier Nurse consulted with Dr. Ronita Hipps for a labor check.  Dr. Ronita Hipps gave client a choice of discharge or being admitted in observation tonight. Client opted to go home rather than be admitted overnight.  Her contractions have not changed in character and her cervix has not changed.  [name], RN, MSN, NP-BC Nurse Practitioner, [facility] for [employer], [place] [facility] 05/13/2019 7:30 PM

## 2019-05-13 NOTE — Progress Notes (Signed)
Dr Ronita Hipps notified of pt's VE, contraction pattern, and FHR pattern, orders received to recheck in an hour

## 2019-05-13 NOTE — Discharge Instructions (Signed)
Return if your contractions are worsening - stronger or more frequent Return if your water breaks or if you have vaginal bleeding. Your baby's heart rate looks good on the monitor tonight. You may be almost ready to start in early labor but your cervix has not changed.

## 2019-05-13 NOTE — Progress Notes (Signed)
Dr. Ronita Hipps notified of patient's cervical exam and contraction pattern. Per Dr. Ronita Hipps, give patient option to stay overnight for observation or patient can be discharged home. Discussed with patient plan of care. Patient stated she will go home.

## 2019-05-15 ENCOUNTER — Telehealth (HOSPITAL_COMMUNITY): Payer: Self-pay | Admitting: *Deleted

## 2019-05-15 NOTE — Telephone Encounter (Signed)
Preadmission screen  

## 2019-05-16 ENCOUNTER — Telehealth (HOSPITAL_COMMUNITY): Payer: Self-pay | Admitting: *Deleted

## 2019-05-16 NOTE — Patient Instructions (Signed)
Gabrielle Hughes  05/16/2019   Your procedure is scheduled on:  05/26/19  Arrive at 73 at Entrance C on [address] at [facility]  and [address]. You are invited to use the FREE valet parking or use the Visitor's parking deck.  Pick up the phone at the desk and dial [phone].  Call this number if you have problems the morning of surgery: [phone]  Remember:   Do not eat food:(After Midnight) Desps de medianoche.  Do not drink clear liquids: (After Midnight) Desps de medianoche.  Take these medicines the morning of surgery with A SIP OF WATER:  none   Do not wear jewelry, make-up or nail polish.  Do not wear lotions, powders, or perfumes. Do not wear deodorant.  Do not shave 48 hours prior to surgery.  Do not bring valuables to the hospital.  [facility] is not   responsible for any belongings or valuables brought to the hospital.  Contacts, dentures or bridgework may not be worn into surgery.  Leave suitcase in the car. After surgery it may be brought to your room.  For patients admitted to the hospital, checkout time is 11:00 AM the day of              discharge.      Please read over the following fact sheets that you were given:     Preparing for Surgery

## 2019-05-16 NOTE — Telephone Encounter (Signed)
Preadmission screen  

## 2019-05-17 ENCOUNTER — Encounter (HOSPITAL_COMMUNITY): Payer: Self-pay

## 2019-05-24 ENCOUNTER — Other Ambulatory Visit: Payer: Self-pay

## 2019-05-24 ENCOUNTER — Other Ambulatory Visit (HOSPITAL_COMMUNITY)
Admission: RE | Admit: 2019-05-24 | Discharge: 2019-05-24 | Disposition: A | Discharge status: Home / Self Care | Payer: 59 | Source: Ambulatory Visit | Attending: Obstetrics | Admitting: Obstetrics

## 2019-05-24 DIAGNOSIS — Z20828 Contact with and (suspected) exposure to other viral communicable diseases: Secondary | ICD-10-CM | POA: Insufficient documentation

## 2019-05-24 DIAGNOSIS — Z01812 Encounter for preprocedural laboratory examination: Secondary | ICD-10-CM | POA: Diagnosis not present

## 2019-05-24 HISTORY — DX: Nausea with vomiting, unspecified: R11.2

## 2019-05-24 HISTORY — DX: Other specified postprocedural states: Z98.890

## 2019-05-24 HISTORY — DX: Other complications of anesthesia, initial encounter: T88.59XA

## 2019-05-24 LAB — CBC
HCT: 32.5 % — ABNORMAL LOW (ref 36.0–46.0)
Hemoglobin: 10.3 g/dL — ABNORMAL LOW (ref 12.0–15.0)
MCH: 23.5 pg — ABNORMAL LOW (ref 26.0–34.0)
MCHC: 31.7 g/dL (ref 30.0–36.0)
MCV: 74 fL — ABNORMAL LOW (ref 80.0–100.0)
Platelets: 238 10*3/uL (ref 150–400)
RBC: 4.39 MIL/uL (ref 3.87–5.11)
RDW: 15.2 % (ref 11.5–15.5)
WBC: 13.2 10*3/uL — ABNORMAL HIGH (ref 4.0–10.5)
nRBC: 0 % (ref 0.0–0.2)

## 2019-05-24 LAB — TYPE AND SCREEN
ABO/RH(D): O POS
Antibody Screen: NEGATIVE

## 2019-05-24 LAB — SARS CORONAVIRUS 2 (TAT 6-24 HRS): SARS Coronavirus 2: NEGATIVE

## 2019-05-24 NOTE — MAU Note (Signed)
Covid swab collected.Pt tolerated well. Pt asymptomatic. PT waiting for labs to be drawn

## 2019-05-25 LAB — ABO/RH: ABO/RH(D): O POS

## 2019-05-25 LAB — RPR: RPR Ser Ql: NONREACTIVE

## 2019-05-25 NOTE — H&P (Signed)
Gabrielle Hughes is a 28 y.o. female presenting for rpt csection and TL at 39w. OB History    Gravida  2   Para  1   Term  1   Preterm      AB      Living  1     SAB      TAB      Ectopic      Multiple  0   Live Births  1          Past Medical History:  Diagnosis Date  . Complication of anesthesia   . PONV (postoperative nausea and vomiting)    Past Surgical History:  Procedure Laterality Date  . BUNIONECTOMY     rt and left  . CESAREAN SECTION N/A 05/23/2018   Procedure: CESAREAN SECTION;  Surgeon: Brien Few, MD;  Location: [place];  Service: Obstetrics;  Laterality: N/A;   Family History: family history includes Cancer in her paternal grandfather; Hypertension in her mother. Social History:  reports that she has never smoked. She has never used smokeless tobacco. She reports previous alcohol use. She reports that she does not use drugs.     Maternal Diabetes: No Genetic Screening: Normal Maternal Ultrasounds/Referrals: Normal Fetal Ultrasounds or other Referrals:  None Maternal Substance Abuse:  No Significant Maternal Medications:  None Significant Maternal Lab Results:  None Other Comments:  None  Review of Systems  Constitutional: Negative.   All other systems reviewed and are negative.  Maternal Medical History:  Fetal activity: Perceived fetal activity is normal.   Last perceived fetal movement was within the past hour.    Prenatal complications: Polyhydramnios and preterm labor.   Prenatal Complications - Diabetes: none.      unknown if currently breastfeeding. Maternal Exam:  Uterine Assessment: Contraction strength is mild.  Contraction frequency is irregular.   Abdomen: Patient reports no abdominal tenderness. Surgical scars: low transverse.   Fetal presentation: vertex  Introitus: Normal vulva. Normal vagina.  Ferning test: not done.  Nitrazine test: not done. Amniotic fluid character: not assessed.  Pelvis:  questionable for delivery.   Cervix: Cervix evaluated by digital exam.     Physical Exam  Nursing note and vitals reviewed. Constitutional: She is oriented to person, place, and time. She appears well-developed and well-nourished.  HENT:  Head: Normocephalic and atraumatic.  Neck: Normal range of motion. Neck supple.  Cardiovascular: Normal rate and regular rhythm.  Respiratory: Effort normal and breath sounds normal.  GI: Soft. Bowel sounds are normal.  Genitourinary:    Vulva, vagina and uterus normal.   Musculoskeletal: Normal range of motion.  Neurological: She is alert and oriented to person, place, and time. She has normal reflexes.  Skin: Skin is warm and dry.  Psychiatric: She has a normal mood and affect.    Prenatal labs: ABO, Rh: --/--/O POS, O POS (08/28 0848) Antibody: NEG (08/28 0848) Rubella: Nonimmune (02/20 0000) RPR: NON REACTIVE (08/28 0848)  HBsAg: Negative (02/20 0000)  HIV: Non-reactive (02/20 0000)  GBS: Positive (08/11 0000)   Assessment/Plan: 39wk IUP Previous csection for rpt and TL Consent done.   Lyell Clugston J 05/25/2019, 8:32 PM

## 2019-05-26 ENCOUNTER — Encounter (HOSPITAL_COMMUNITY): Payer: Self-pay

## 2019-05-26 ENCOUNTER — Inpatient Hospital Stay (HOSPITAL_COMMUNITY): Payer: 59 | Admitting: Anesthesiology

## 2019-05-26 ENCOUNTER — Other Ambulatory Visit: Payer: Self-pay

## 2019-05-26 ENCOUNTER — Encounter (HOSPITAL_COMMUNITY)
Admission: RE | Disposition: A | Discharge status: Home / Self Care | Payer: Self-pay | Source: Home / Self Care | Attending: Obstetrics and Gynecology

## 2019-05-26 ENCOUNTER — Inpatient Hospital Stay (HOSPITAL_COMMUNITY)
Admission: RE | Admit: 2019-05-26 | Discharge: 2019-05-28 | DRG: 784 | Disposition: A | Discharge status: Home / Self Care | Payer: 59 | Attending: Obstetrics and Gynecology | Admitting: Obstetrics and Gynecology

## 2019-05-26 DIAGNOSIS — Z98891 History of uterine scar from previous surgery: Secondary | ICD-10-CM

## 2019-05-26 DIAGNOSIS — D62 Acute posthemorrhagic anemia: Secondary | ICD-10-CM | POA: Diagnosis not present

## 2019-05-26 DIAGNOSIS — O99824 Streptococcus B carrier state complicating childbirth: Secondary | ICD-10-CM | POA: Diagnosis present

## 2019-05-26 DIAGNOSIS — O9081 Anemia of the puerperium: Secondary | ICD-10-CM | POA: Diagnosis not present

## 2019-05-26 DIAGNOSIS — O34211 Maternal care for low transverse scar from previous cesarean delivery: Secondary | ICD-10-CM | POA: Diagnosis present

## 2019-05-26 DIAGNOSIS — Z3A39 39 weeks gestation of pregnancy: Secondary | ICD-10-CM | POA: Diagnosis not present

## 2019-05-26 DIAGNOSIS — O34219 Maternal care for unspecified type scar from previous cesarean delivery: Secondary | ICD-10-CM | POA: Diagnosis present

## 2019-05-26 DIAGNOSIS — Z302 Encounter for sterilization: Secondary | ICD-10-CM

## 2019-05-26 SURGERY — Surgical Case
Anesthesia: Spinal | Site: Abdomen | Laterality: Bilateral | Wound class: Clean Contaminated

## 2019-05-26 MED ORDER — ONDANSETRON HCL 4 MG/2ML IJ SOLN: 4.0000 mg | Freq: Three times a day (TID) | INTRAMUSCULAR | Status: DC | PRN

## 2019-05-26 MED ORDER — DIPHENHYDRAMINE HCL 25 MG PO CAPS: 25.0000 mg | ORAL_CAPSULE | Freq: Four times a day (QID) | ORAL | Status: DC | PRN

## 2019-05-26 MED ORDER — METOCLOPRAMIDE HCL 5 MG/ML IJ SOLN: 10.0000 mg | Freq: Once | INTRAMUSCULAR | Status: DC | PRN

## 2019-05-26 MED ORDER — DIPHENHYDRAMINE HCL 50 MG/ML IJ SOLN: 12.5000 mg | INTRAMUSCULAR | Status: DC | PRN

## 2019-05-26 MED ORDER — SIMETHICONE 80 MG PO CHEW: 80.0000 mg | CHEWABLE_TABLET | ORAL | Status: DC | PRN

## 2019-05-26 MED ORDER — COCONUT OIL OIL: 1.0000 "application " | TOPICAL_OIL | Status: DC | PRN

## 2019-05-26 MED ORDER — SIMETHICONE 80 MG PO CHEW
80.0000 mg | CHEWABLE_TABLET | ORAL | Status: DC
  Administered 2019-05-26 – 2019-05-28 (×2): 80 mg via ORAL
  Filled 2019-05-26: qty 1

## 2019-05-26 MED ORDER — DIBUCAINE (PERIANAL) 1 % EX OINT: 1.0000 "application " | TOPICAL_OINTMENT | CUTANEOUS | Status: DC | PRN

## 2019-05-26 MED ORDER — LACTATED RINGERS IV SOLN: INTRAVENOUS | Status: DC

## 2019-05-26 MED ORDER — MORPHINE SULFATE (PF) 0.5 MG/ML IJ SOLN
INTRAMUSCULAR | Status: DC | PRN
  Administered 2019-05-26: .15 mg via INTRATHECAL

## 2019-05-26 MED ORDER — BUPIVACAINE IN DEXTROSE 0.75-8.25 % IT SOLN
INTRATHECAL | Status: DC | PRN
  Administered 2019-05-26: 1.4 mL via INTRATHECAL

## 2019-05-26 MED ORDER — SODIUM CHLORIDE 0.9 % IV SOLN
INTRAVENOUS | Status: DC | PRN
  Administered 2019-05-26: 14:00:00 via INTRAVENOUS

## 2019-05-26 MED ORDER — ZOLPIDEM TARTRATE 5 MG PO TABS: 5.0000 mg | ORAL_TABLET | Freq: Every evening | ORAL | Status: DC | PRN

## 2019-05-26 MED ORDER — BUPIVACAINE HCL (PF) 0.25 % IJ SOLN
INTRAMUSCULAR | Status: AC
  Filled 2019-05-26: qty 10

## 2019-05-26 MED ORDER — MENTHOL 3 MG MT LOZG: 1.0000 | LOZENGE | OROMUCOSAL | Status: DC | PRN

## 2019-05-26 MED ORDER — FENTANYL CITRATE (PF) 100 MCG/2ML IJ SOLN: 25.0000 ug | INTRAMUSCULAR | Status: DC | PRN

## 2019-05-26 MED ORDER — SODIUM CHLORIDE 0.9 % IV SOLN
INTRAVENOUS | Status: DC | PRN
  Administered 2019-05-26: 14:00:00 40 [IU] via INTRAVENOUS

## 2019-05-26 MED ORDER — METHYLERGONOVINE MALEATE 0.2 MG/ML IJ SOLN: 0.2000 mg | INTRAMUSCULAR | Status: DC | PRN

## 2019-05-26 MED ORDER — KETOROLAC TROMETHAMINE 30 MG/ML IJ SOLN
30.0000 mg | Freq: Four times a day (QID) | INTRAMUSCULAR | Status: AC | PRN
  Administered 2019-05-26: 16:00:00 30 mg via INTRAVENOUS
  Filled 2019-05-26: qty 1

## 2019-05-26 MED ORDER — PHENYLEPHRINE HCL-NACL 20-0.9 MG/250ML-% IV SOLN
INTRAVENOUS | Status: DC | PRN
  Administered 2019-05-26: 60 ug/min via INTRAVENOUS

## 2019-05-26 MED ORDER — PRENATAL MULTIVITAMIN CH
1.0000 | ORAL_TABLET | Freq: Every day | ORAL | Status: DC
  Administered 2019-05-27 – 2019-05-28 (×2): 1 via ORAL
  Filled 2019-05-26 (×2): qty 1

## 2019-05-26 MED ORDER — KETOROLAC TROMETHAMINE 30 MG/ML IJ SOLN: 30.0000 mg | Freq: Four times a day (QID) | INTRAMUSCULAR | Status: AC | PRN

## 2019-05-26 MED ORDER — NALOXONE HCL 0.4 MG/ML IJ SOLN: 0.4000 mg | INTRAMUSCULAR | Status: DC | PRN

## 2019-05-26 MED ORDER — MEPERIDINE HCL 25 MG/ML IJ SOLN: 6.2500 mg | INTRAMUSCULAR | Status: DC | PRN

## 2019-05-26 MED ORDER — SCOPOLAMINE 1 MG/3DAYS TD PT72: 1.0000 | MEDICATED_PATCH | Freq: Once | TRANSDERMAL | Status: DC

## 2019-05-26 MED ORDER — METHYLERGONOVINE MALEATE 0.2 MG PO TABS: 0.2000 mg | ORAL_TABLET | ORAL | Status: DC | PRN

## 2019-05-26 MED ORDER — SIMETHICONE 80 MG PO CHEW
80.0000 mg | CHEWABLE_TABLET | Freq: Three times a day (TID) | ORAL | Status: DC
  Administered 2019-05-27 – 2019-05-28 (×4): 80 mg via ORAL
  Filled 2019-05-26 (×4): qty 1

## 2019-05-26 MED ORDER — FENTANYL CITRATE (PF) 100 MCG/2ML IJ SOLN
INTRAMUSCULAR | Status: DC | PRN
  Administered 2019-05-26: 15 ug via INTRATHECAL

## 2019-05-26 MED ORDER — FENTANYL CITRATE (PF) 100 MCG/2ML IJ SOLN
INTRAMUSCULAR | Status: AC
  Filled 2019-05-26: qty 2

## 2019-05-26 MED ORDER — SENNOSIDES-DOCUSATE SODIUM 8.6-50 MG PO TABS
2.0000 | ORAL_TABLET | ORAL | Status: DC
  Administered 2019-05-26 – 2019-05-28 (×2): 2 via ORAL
  Filled 2019-05-26 (×2): qty 2

## 2019-05-26 MED ORDER — CEFAZOLIN SODIUM-DEXTROSE 2-4 GM/100ML-% IV SOLN
INTRAVENOUS | Status: AC
  Filled 2019-05-26: qty 100

## 2019-05-26 MED ORDER — OXYTOCIN 40 UNITS IN NORMAL SALINE INFUSION - SIMPLE MED: 2.5000 [IU]/h | INTRAVENOUS | Status: DC

## 2019-05-26 MED ORDER — BUPIVACAINE HCL (PF) 0.25 % IJ SOLN
INTRAMUSCULAR | Status: DC | PRN
  Administered 2019-05-26: 20 mL

## 2019-05-26 MED ORDER — LACTATED RINGERS IV SOLN
INTRAVENOUS | Status: DC
  Administered 2019-05-26 (×2): via INTRAVENOUS

## 2019-05-26 MED ORDER — DEXAMETHASONE SODIUM PHOSPHATE 10 MG/ML IJ SOLN
INTRAMUSCULAR | Status: DC | PRN
  Administered 2019-05-26: 10 mg via INTRAVENOUS

## 2019-05-26 MED ORDER — SCOPOLAMINE 1 MG/3DAYS TD PT72
MEDICATED_PATCH | TRANSDERMAL | Status: DC | PRN
  Administered 2019-05-26: 1 via TRANSDERMAL

## 2019-05-26 MED ORDER — ONDANSETRON HCL 4 MG/2ML IJ SOLN
INTRAMUSCULAR | Status: DC | PRN
  Administered 2019-05-26: 4 mg via INTRAVENOUS

## 2019-05-26 MED ORDER — OXYCODONE-ACETAMINOPHEN 5-325 MG PO TABS
1.0000 | ORAL_TABLET | ORAL | Status: DC | PRN
  Administered 2019-05-27: 18:00:00 2 via ORAL
  Filled 2019-05-26: qty 2

## 2019-05-26 MED ORDER — CEFAZOLIN SODIUM-DEXTROSE 2-4 GM/100ML-% IV SOLN
2.0000 g | INTRAVENOUS | Status: AC
  Administered 2019-05-26: 14:00:00 2 g via INTRAVENOUS

## 2019-05-26 MED ORDER — KETOROLAC TROMETHAMINE 30 MG/ML IJ SOLN
30.0000 mg | Freq: Four times a day (QID) | INTRAMUSCULAR | Status: AC
  Administered 2019-05-26 – 2019-05-27 (×3): 30 mg via INTRAVENOUS
  Filled 2019-05-26 (×3): qty 1

## 2019-05-26 MED ORDER — DIPHENHYDRAMINE HCL 25 MG PO CAPS: 25.0000 mg | ORAL_CAPSULE | ORAL | Status: DC | PRN

## 2019-05-26 MED ORDER — SODIUM CHLORIDE 0.9% FLUSH: 3.0000 mL | INTRAVENOUS | Status: DC | PRN

## 2019-05-26 MED ORDER — DEXAMETHASONE SODIUM PHOSPHATE 10 MG/ML IJ SOLN
INTRAMUSCULAR | Status: AC
  Filled 2019-05-26: qty 1

## 2019-05-26 MED ORDER — WITCH HAZEL-GLYCERIN EX PADS: 1.0000 "application " | MEDICATED_PAD | CUTANEOUS | Status: DC | PRN

## 2019-05-26 MED ORDER — ONDANSETRON HCL 4 MG/2ML IJ SOLN
INTRAMUSCULAR | Status: AC
  Filled 2019-05-26: qty 2

## 2019-05-26 MED ORDER — NALOXONE HCL 4 MG/10ML IJ SOLN
1.0000 ug/kg/h | INTRAVENOUS | Status: DC | PRN
  Filled 2019-05-26: qty 5

## 2019-05-26 MED ORDER — PHENYLEPHRINE HCL-NACL 20-0.9 MG/250ML-% IV SOLN
INTRAVENOUS | Status: AC
  Filled 2019-05-26: qty 250

## 2019-05-26 MED ORDER — IBUPROFEN 800 MG PO TABS
800.0000 mg | ORAL_TABLET | Freq: Four times a day (QID) | ORAL | Status: DC
  Administered 2019-05-27 – 2019-05-28 (×4): 800 mg via ORAL
  Filled 2019-05-26 (×4): qty 1

## 2019-05-26 MED ORDER — ACETAMINOPHEN 500 MG PO TABS
1000.0000 mg | ORAL_TABLET | Freq: Four times a day (QID) | ORAL | Status: AC
  Administered 2019-05-26 – 2019-05-27 (×3): 1000 mg via ORAL
  Filled 2019-05-26 (×3): qty 2

## 2019-05-26 MED ORDER — MORPHINE SULFATE (PF) 0.5 MG/ML IJ SOLN
INTRAMUSCULAR | Status: AC
  Filled 2019-05-26: qty 10

## 2019-05-26 MED ORDER — LACTATED RINGERS IV SOLN
INTRAVENOUS | Status: DC
  Administered 2019-05-26: 23:00:00 via INTRAVENOUS

## 2019-05-26 MED ORDER — TETANUS-DIPHTH-ACELL PERTUSSIS 5-2.5-18.5 LF-MCG/0.5 IM SUSP: 0.5000 mL | Freq: Once | INTRAMUSCULAR | Status: DC

## 2019-05-26 SURGICAL SUPPLY — 33 items
BENZOIN TINCTURE PRP APPL 2/3 (GAUZE/BANDAGES/DRESSINGS) ×2 IMPLANT
CLAMP CORD UMBIL (MISCELLANEOUS) IMPLANT
CLOTH BEACON ORANGE TIMEOUT ST (SAFETY) ×2 IMPLANT
DRSG OPSITE POSTOP 4X10 (GAUZE/BANDAGES/DRESSINGS) ×2 IMPLANT
ELECT REM PT RETURN 9FT ADLT (ELECTROSURGICAL) ×2
ELECTRODE REM PT RTRN 9FT ADLT (ELECTROSURGICAL) ×1 IMPLANT
EXTRACTOR VACUUM M CUP 4 TUBE (SUCTIONS) IMPLANT
GLOVE BIO SURGEON STRL SZ7.5 (GLOVE) ×2 IMPLANT
GLOVE BIOGEL PI IND STRL 7.0 (GLOVE) ×1 IMPLANT
GLOVE BIOGEL PI INDICATOR 7.0 (GLOVE) ×1
GOWN STRL REUS W/TWL LRG LVL3 (GOWN DISPOSABLE) ×4 IMPLANT
KIT ABG SYR 3ML LUER SLIP (SYRINGE) IMPLANT
NEEDLE HYPO 22GX1.5 SAFETY (NEEDLE) ×2 IMPLANT
NEEDLE HYPO 25X5/8 SAFETYGLIDE (NEEDLE) IMPLANT
NEEDLE SPNL 20GX3.5 QUINCKE YW (NEEDLE) IMPLANT
NS IRRIG 1000ML POUR BTL (IV SOLUTION) ×2 IMPLANT
PACK C SECTION WH (CUSTOM PROCEDURE TRAY) ×2 IMPLANT
PAD OB MATERNITY 4.3X12.25 (PERSONAL CARE ITEMS) ×2 IMPLANT
STRIP CLOSURE SKIN 1/2X4 (GAUZE/BANDAGES/DRESSINGS) ×2 IMPLANT
SUT MNCRL 0 VIOLET CTX 36 (SUTURE) ×2 IMPLANT
SUT MNCRL AB 3-0 PS2 27 (SUTURE) IMPLANT
SUT MON AB 2-0 CT1 27 (SUTURE) ×2 IMPLANT
SUT MON AB-0 CT1 36 (SUTURE) ×4 IMPLANT
SUT MONOCRYL 0 CTX 36 (SUTURE) ×2
SUT PLAIN 0 NONE (SUTURE) IMPLANT
SUT PLAIN 2 0 (SUTURE)
SUT PLAIN 2 0 XLH (SUTURE) IMPLANT
SUT PLAIN ABS 2-0 CT1 27XMFL (SUTURE) IMPLANT
SYR 20CC LL (SYRINGE) IMPLANT
SYR CONTROL 10ML LL (SYRINGE) ×2 IMPLANT
TOWEL OR 17X24 6PK STRL BLUE (TOWEL DISPOSABLE) ×2 IMPLANT
TRAY FOLEY W/BAG SLVR 14FR LF (SET/KITS/TRAYS/PACK) ×2 IMPLANT
WATER STERILE IRR 1000ML POUR (IV SOLUTION) ×2 IMPLANT

## 2019-05-26 NOTE — Anesthesia Postprocedure Evaluation (Signed)
Anesthesia Post Note  Patient: Gabrielle Hughes  Procedure(s) Performed: Repeat CESAREAN SECTION WITH BILATERAL TUBAL LIGATION (Bilateral Abdomen)     Patient location during evaluation: PACU Anesthesia Type: Spinal Level of consciousness: awake and alert Pain management: pain level controlled Vital Signs Assessment: post-procedure vital signs reviewed and stable Respiratory status: spontaneous breathing and respiratory function stable Cardiovascular status: blood pressure returned to baseline and stable Postop Assessment: no headache, no backache, spinal receding and no apparent nausea or vomiting Anesthetic complications: no    Last Vitals:  Vitals:   05/26/19 1535 05/26/19 1545  BP:  106/67  Pulse: (!) 57 (!) 53  Resp: 20 16  Temp:  36.5 C  SpO2: 98% 98%    Last Pain:  Vitals:   05/26/19 1545  TempSrc: Oral  PainSc: 3    Pain Goal:                   [name]

## 2019-05-26 NOTE — Op Note (Signed)
Cesarean Section Procedure Note  Indications: previous uterine incision kerr x one  and elective TL  Pre-operative Diagnosis: 39 week 0 day pregnancy.  Post-operative Diagnosis: same  Surgeon: Lovenia Kim   Assistants: Murrell Redden, MD  Anesthesia: Local anesthesia 0.25.% bupivacaine and Spinal anesthesia  ASA Class: 2  Procedure Details  The patient was seen in the Holding Room. The risks, benefits, complications, treatment options, and expected outcomes were discussed with the patient.  The patient concurred with the proposed plan, giving informed consent. The risks of anesthesia, infection, bleeding and possible injury to other organs discussed. Injury to bowel, bladder, or ureter with possible need for repair discussed. Possible need for transfusion with secondary risks of hepatitis or HIV acquisition discussed. Post operative complications to include but not limited to DVT, PE and Pneumonia noted. The site of surgery properly noted/marked. The patient was taken to Operating Room # C, identified as Gabrielle Hughes and the procedure verified as C-Section Delivery. A Time Out was held and the above information confirmed.  After induction of anesthesia, the patient was draped and prepped in the usual sterile manner. A Pfannenstiel incision was made and carried down through the subcutaneous tissue to the fascia. Fascial incision was made and extended transversely using Mayo scissors. The fascia was separated from the underlying rectus tissue superiorly and inferiorly. The peritoneum was identified and entered. Peritoneal incision was extended longitudinally. The utero-vesical peritoneal reflection was incised transversely and the bladder flap was bluntly freed from the lower uterine segment. A low transverse uterine incision(Kerr hysterotomy) was made. Delivered from OA presentation was a  female with Apgar scores of 8 at one minute and 9 at five minutes. Bulb suctioning gently performed. Neonatal  team in attendance.After the umbilical cord was clamped and cut cord blood was obtained for evaluation. The placenta was removed intact and appeared normal. The uterus was curetted with a dry lap pack. Good hemostasis was noted.The uterine outline, tubes and ovaries appeared normal. The uterine incision was closed with running locked sutures of 0 Monocryl x 1 layers. Hemostasis was observed. Modified Pomeroy bilateral partial salpingectomy without complications. The parietal peritoneum was closed with a running 2-0 Monocryl suture. The fascia was then reapproximated with running sutures of 0 Monocryl. The skin was reapproximated with 3-0 monocryl after [place] closure with 2-0 plain.  Instrument, sponge, and needle counts were correct prior the abdominal closure and at the conclusion of the case.   Findings: FTLM, post placenta, nl adnexa  Estimated Blood Loss:  500         Drains: foley                 Specimens: placenta and tubal segments                 Complications:  None; patient tolerated the procedure well.         Disposition: PACU - hemodynamically stable.         Condition: stable  Attending Attestation: I performed the procedure.

## 2019-05-26 NOTE — Transfer of Care (Signed)
Immediate Anesthesia Transfer of Care Note  Patient: Gabrielle Hughes  Procedure(s) Performed: Repeat CESAREAN SECTION WITH BILATERAL TUBAL LIGATION (Bilateral Abdomen)  Patient Location: PACU  Anesthesia Type:Spinal  Level of Consciousness: awake, alert  and oriented  Airway & Oxygen Therapy: Patient Spontanous Breathing  Post-op Assessment: Report given to RN and Post -op Vital signs reviewed and stable  Post vital signs: Reviewed and stable  Last Vitals:  Vitals Value Taken Time  BP    Temp    Pulse 78 05/26/19 1428  Resp 12 05/26/19 1428  SpO2 97 % 05/26/19 1428  Vitals shown include unvalidated device data.  Last Pain:  Vitals:   05/26/19 1122  TempSrc: Oral         Complications: No apparent anesthesia complications

## 2019-05-26 NOTE — Progress Notes (Signed)
Patient ID: Gabrielle Hughes, female   DOB: 05/19/1991, 28 y.o.   MRN: [id number] Patient seen and examined. Consent witnessed and signed. No changes noted. Update completed. BP 130/77 (BP Location: Left Arm)   Pulse 91   Temp 97.8 F (36.6 C) (Oral)   Resp 19   Ht 5\' 9"  (1.753 m)   Wt 108.9 kg   SpO2 97%   BMI 35.44 kg/m   CBC    Component Value Date/Time   WBC 13.2 (H) 05/24/2019 0848   RBC 4.39 05/24/2019 0848   HGB 10.3 (L) 05/24/2019 0848   HCT 32.5 (L) 05/24/2019 0848   PLT 238 05/24/2019 0848   MCV 74.0 (L) 05/24/2019 0848   MCH 23.5 (L) 05/24/2019 0848   MCHC 31.7 05/24/2019 0848   RDW 15.2 05/24/2019 0848

## 2019-05-26 NOTE — Anesthesia Preprocedure Evaluation (Signed)
Anesthesia Evaluation  Patient identified by MRN, date of birth, ID band Patient awake    Reviewed: Allergy & Precautions, NPO status , Patient's Chart, lab work & pertinent test results  History of Anesthesia Complications (+) PONV  Airway Mallampati: II  TM Distance: >3 FB Neck ROM: Full    Dental no notable dental hx.    Pulmonary neg pulmonary ROS,    Pulmonary exam normal breath sounds clear to auscultation       Cardiovascular negative cardio ROS Normal cardiovascular exam Rhythm:Regular Rate:Normal     Neuro/Psych negative neurological ROS  negative psych ROS   GI/Hepatic negative GI ROS, Neg liver ROS,   Endo/Other  negative endocrine ROS  Renal/GU negative Renal ROS  negative genitourinary   Musculoskeletal negative musculoskeletal ROS (+)   Abdominal   Peds negative pediatric ROS (+)  Hematology negative hematology ROS (+)   Anesthesia Other Findings   Reproductive/Obstetrics (+) Pregnancy                             Anesthesia Physical Anesthesia Plan  ASA: II  Anesthesia Plan: Spinal   Post-op Pain Management:    Induction:   PONV Risk Score and Plan: 3 and Treatment may vary due to age or medical condition and Ondansetron  Airway Management Planned: Natural Airway  Additional Equipment:   Intra-op Plan:   Post-operative Plan:   Informed Consent: I have reviewed the patients History and Physical, chart, labs and discussed the procedure including the risks, benefits and alternatives for the proposed anesthesia with the patient or authorized representative who has indicated his/her understanding and acceptance.     Dental advisory given  Plan Discussed with:   Anesthesia Plan Comments:         Anesthesia Quick Evaluation

## 2019-05-26 NOTE — Anesthesia Procedure Notes (Signed)
Spinal  Patient location during procedure: OR Staffing Anesthesiologist: Shemeika Starzyk, MD Performed: anesthesiologist  Preanesthetic Checklist Completed: patient identified, site marked, surgical consent, pre-op evaluation, timeout performed, IV checked, risks and benefits discussed and monitors and equipment checked Spinal Block Patient position: sitting Prep: DuraPrep Patient monitoring: heart rate, continuous pulse ox and blood pressure Approach: right paramedian Location: L3-4 Injection technique: single-shot Needle Needle type: Sprotte  Needle gauge: 24 G Needle length: 9 cm Additional Notes Expiration date of kit checked and confirmed. Patient tolerated procedure well, without complications.       

## 2019-05-27 LAB — CBC
HCT: 27 % — ABNORMAL LOW (ref 36.0–46.0)
Hemoglobin: 8.5 g/dL — ABNORMAL LOW (ref 12.0–15.0)
MCH: 23.6 pg — ABNORMAL LOW (ref 26.0–34.0)
MCHC: 31.5 g/dL (ref 30.0–36.0)
MCV: 75 fL — ABNORMAL LOW (ref 80.0–100.0)
Platelets: 211 10*3/uL (ref 150–400)
RBC: 3.6 MIL/uL — ABNORMAL LOW (ref 3.87–5.11)
RDW: 15.3 % (ref 11.5–15.5)
WBC: 19.3 10*3/uL — ABNORMAL HIGH (ref 4.0–10.5)
nRBC: 0 % (ref 0.0–0.2)

## 2019-05-27 MED ORDER — POLYSACCHARIDE IRON COMPLEX 150 MG PO CAPS
150.0000 mg | ORAL_CAPSULE | Freq: Every day | ORAL | Status: DC
  Administered 2019-05-28: 150 mg via ORAL
  Filled 2019-05-27: qty 1

## 2019-05-27 MED ORDER — MAGNESIUM OXIDE 400 (241.3 MG) MG PO TABS
400.0000 mg | ORAL_TABLET | Freq: Every day | ORAL | Status: DC
  Administered 2019-05-28: 400 mg via ORAL
  Filled 2019-05-27: qty 1

## 2019-05-27 MED ORDER — MEASLES, MUMPS & RUBELLA VAC IJ SOLR: 0.5000 mL | Freq: Once | INTRAMUSCULAR | Status: DC

## 2019-05-27 NOTE — Progress Notes (Signed)
POSTOPERATIVE DAY # 1 S/P Repeat LTCS with desired sterilization s/p partial bilateral salpingectomy, baby boy "[name]"  S:         Reports feeling okay; reports slight incisional pain             Tolerating po intake / no nausea / no vomiting / no flatus / no BM  Denies dizziness, SOB, or CP             Bleeding is light             Pain controlled with Tylenol and Motrin             Up ad lib / ambulatory/ foley catheter out a 1am per patient and reports no void or no I&O cath, however at the time of this note completion patient voided 400cc  Newborn breast feeding - going well, but spitting up amniotic fluid today and not as interested;  / Circumcision - planning today   O:  VS: BP (!) 111/52 (BP Location: Left Arm)   Pulse (!) 52   Temp 97.9 F (36.6 C) (Oral)   Resp 17   Ht 5' 9" (1.753 m)   Wt 108.9 kg   SpO2 98%   BMI 35.44 kg/m    LABS:               Recent Labs    05/24/19 0848 05/27/19 0525  WBC 13.2* 19.3*  HGB 10.3* 8.5*  PLT 238 211               Bloodtype: --/--/O POS, O POS (08/28 0848)  Rubella: Nonimmune (02/20 0000)                                             I&O: Intake/Output      08/30 0701 - 08/31 0700 08/31 0701 - 09/01 0700   P.O. 280    I.V. (mL/kg) 2807 (25.8)    Total Intake(mL/kg) 3087 (28.3)    Urine (mL/kg/hr) 400    Blood 302    Total Output 702    Net +2385                      Physical Exam:             Alert and Oriented X3  Lungs: Clear and unlabored  Heart: regular rate and rhythm / no murmurs  Abdomen: soft, non-tender, gaseous distention; active bowel sounds in all quadrants              Fundus: firm, non-tender, U-1             Dressing: honeycomb with steri-strips; sanguinous drainage noted on right side of dressing, marked <50%             Incision:  approximated with sutures / no erythema / no ecchymosis / no drainage  Perineum: intact  Lochia: small rubra on pad   Extremities: trace LE edema, no calf pain or  tenderness  A:        POD # 1 S/P Repeat LTCS and bilateral partial salpingectomy for desired sterilization             ABL anemia compounding chronic maternal IDA - asymptomatic   Rubella Non-immune  P:        Routine postoperative care  Encouraged emptying bladder every 2-3 hours to avoid overdistention  Ambulation, warm liquids, warm shower to promote bowel motility   Begin oral FE tomorrow when bowel function improves   See lactation today   Offer MMR vaccine prior to d/c   May consider early d/c home tomorrow  Lars Pinks, MSN, CNM [facility]

## 2019-05-27 NOTE — Lactation Note (Signed)
This note was copied from a baby's chart. Lactation Consultation Note  Patient Name: Gabrielle Hughes Date: 05/27/2019 Reason for consult: Initial assessment   P2, Baby 44 hours old.  Baby has been spitty.   Mother stated her first child had low blood sugar and was not given the opportunity to work on breastfeeding and was never successful. Mother states she thought her supply was low.  She states this baby has had 3 good feedings since birth but has been sleepy today. Unwrapped baby for feeding and spoon fed him drops. Mother has good flow with hand expression. Attempted on both breasts in football and cross cradle and baby did not sustain latch.   Encouraged STS and continue to spoon feed him. Feed on demand approximately 8-12 times per day.   LC will follow up later today. Provided lactation brochure for resources.     Maternal Data Has patient been taught Hand Expression?: Yes Does the patient have breastfeeding experience prior to this delivery?: Yes  Feeding Feeding Type: Breast Fed  LATCH Score Latch: Repeated attempts needed to sustain latch, nipple held in mouth throughout feeding, stimulation needed to elicit sucking reflex.  Audible Swallowing: None  Type of Nipple: Everted at rest and after stimulation  Comfort (Breast/Nipple): Soft / non-tender  Hold (Positioning): Assistance needed to correctly position infant at breast and maintain latch.  LATCH Score: 6  Interventions Interventions: Breast feeding basics reviewed;Assisted with latch;Skin to skin;Hand express;Adjust position;Support pillows;Position options;Breast compression  Lactation Tools Discussed/Used     Consult Status Consult Status: Follow-up Date: 05/27/19 Follow-up type: In-patient    Vivianne Master [facility] 05/27/2019, 12:01 PM

## 2019-05-27 NOTE — Lactation Note (Signed)
This note was copied from a baby's chart. Lactation Consultation Note  Patient Name: Gabrielle Hughes [id number] Date: 05/27/2019 Reason for consult: Initial assessment;Follow-up assessment  Mom states she recently fed.  Infant swaddled in blankets but eyes are open and he's squirming.  LC offered to assist with bf on other side since mom fed on only one side.  She was agreeable.  Infant was undressed and placed STS.  Mom was able to hand exp. A drop of colostrum on nipple.  Mom has shorter shafted nipples but they evert easily with bf.   LC helped provide pillows/positioning tips given.  Infant latched and with massage and compression began actively sucking.  At first, only a few swallows were heard but after several minutes, the swallows became more frequent.  Jaw movement was rhythmic and lips with flanged with latch.  Infant still feeding after [place] left room.  [facility] taught family how to flange lips if pinching was felt, and how to break latch and re position to latch again for a deeper tug/latch feeling.  EBM 1 ml collected in spoon, parents will feed back to infant.  Manual pump provided. Mom is familiar with pump.  LC encouraged her to pre pump prior to latch.    Plan:  Hand exp prior to latching.   BF infant; try to bf on alternate side after finishing the first side.   Use waking techniques taught and breast compression and massage during feed to keep infant actively sucking. Hand exp after bf and collect ebm to feed back to infant.   Family reminded of cluster feeding and importance of feeding often and thoroughly with cues.   Maternal Data    Feeding Feeding Type: Breast Fed  LATCH Score Latch: Grasps breast easily, tongue down, lips flanged, rhythmical sucking.  Audible Swallowing: A few with stimulation  Type of Nipple: Flat(short shaft; nipple is everted after infant latches)  Comfort (Breast/Nipple): Soft / non-tender  Hold (Positioning): Assistance needed to correctly  position infant at breast and maintain latch.  LATCH Score: 7  Interventions Interventions: Breast feeding basics reviewed;Assisted with latch;Skin to skin;Hand express;Breast compression;Adjust position;Position options;Support pillows;Hand pump  Lactation Tools Discussed/Used     Consult Status Consult Status: Follow-up Date: 05/28/19 Follow-up type: In-patient    Ferne Coe [facility] 05/27/2019, 5:16 PM

## 2019-05-28 MED ORDER — POLYSACCHARIDE IRON COMPLEX 150 MG PO CAPS: 150.0000 mg | ORAL_CAPSULE | Freq: Every day | ORAL | 0 refills | Status: DC

## 2019-05-28 MED ORDER — OXYCODONE-ACETAMINOPHEN 5-325 MG PO TABS: 1.0000 | ORAL_TABLET | Freq: Four times a day (QID) | ORAL | 0 refills | Status: AC | PRN

## 2019-05-28 MED ORDER — IBUPROFEN 800 MG PO TABS: 800.0000 mg | ORAL_TABLET | Freq: Four times a day (QID) | ORAL | 0 refills | Status: DC

## 2019-05-28 MED ORDER — MAGNESIUM OXIDE 400 (241.3 MG) MG PO TABS: 400.0000 mg | ORAL_TABLET | Freq: Every day | ORAL | 0 refills | Status: DC

## 2019-05-28 NOTE — Lactation Note (Signed)
This note was copied from a baby's chart. Lactation Consultation Note  Patient Name: Gabrielle Hughes WCHEN'I Date: 05/28/2019 Reason for consult: Follow-up assessment  Mom's breasts are filling. Hand expression was done with Mom to finger-feed a small amount to entice "[name]." The EBM appears to be transitional milk.   After finger-feeding a small amount of EBM, infant latched with ease. Swallows were immediately noticed (suck:swallow ratio of 1:1 verified with cervical auscultation).   Specifics of an asymmetric latch were shown via [facility] website animation to help her visualize getting the best latch possible.   Mom's questions were answered to her satisfaction. I have no concerns.   [name] [facility] 05/28/2019, 12:15 PM

## 2019-05-28 NOTE — Progress Notes (Signed)
POSTOPERATIVE DAY # 2 S/P repeat CS with BTL  Subjective:  reports feeling well and wants to go home tolerating po intake / no nausea / no vomiting / + flatus / no BM pain well controlled  up ad lib / ambulatory in room / voiding QS  Newborn Breast   Objective: VS: BP 110/74 (BP Location: Left Arm)   Pulse (!) 59   Temp 97.6 F (36.4 C) (Oral)   Resp 18   Ht 5' 9"  (1.753 m)   Wt 108.9 kg   SpO2 98%   BMI 35.44 kg/m        LABS:  Recent Labs    05/27/19 0525  WBC 19.3*  HGB 8.5*  PLT 211    Bloodtype: --/--/O POS, O POS (08/28 0848)  Rubella: Nonimmune (02/20 0000)  - MMR ordered prior to DC                                     I&O: Intake/Output      08/31 0701 - 09/01 0700 09/01 0701 - 09/02 0700   P.O.     I.V. (mL/kg)     Total Intake(mL/kg)     Urine (mL/kg/hr) 600 (0.2)    Blood     Total Output 600    Net -600         Urine Occurrence 400 x     Physical Exam: alert and oriented X3 without any distress or pain lung fields are clear and unlabored heart rate regular / normal rhythm / no mumurs abdomen soft, non-tender, non-distended  / bowel sounds are active uterine fundus firm, non-tender, Ueven surgical incision assessed with honeycomb dressing intact with marked drainage   no erythema / no ecchymosis  perineum: intact lochia: light extremities: trace pedal edema, no calf pain or tenderness, negative Homans  Assessment / Plan:      POD # 2 S/P CS - repeat with BTL ABL anemia - start iron and mag oxide today routine postoperative care -advance activity DC home - awaiting Peds decision on newborn [place], MSN, [facility] 05/28/2019, 8:32 AM

## 2019-05-28 NOTE — Discharge Summary (Signed)
OB Discharge Summary  Patient Name: Gabrielle Hughes DOB: September 07, 1991 MRN: [id number]  Date of admission: 05/26/2019 Intrauterine pregnancy: 8353w2d   Admitting diagnosis: Previous Cesarean Section, Desires Sterilization  Date of discharge: 05/28/2019    Discharge diagnosis: Term Pregnancy Delivered, Anemia and Post-op day 2 s/p repeat CS with BTL     Prenatal history: G2P1001   EDC : 06/02/2019, Date entered prior to episode creation  [facility]  Primary provider : [place] Prenatal course complicated by previous CS  Prenatal Labs: ABO, Rh: --/--/O POS, O POS (08/28 0848) Antibody: NEG (08/28 0848) Rubella: Nonimmune (02/20 0000)  RPR: NON REACTIVE (08/28 0848)  HBsAg: Negative (02/20 0000)  HIV: Non-reactive (02/20 0000)  GBS: Positive (08/11 0000)                                    Hospital course:  Sceduled C/S   28 y.o. yo G2P1001 at 7653w2d was admitted to the hospital 05/26/2019 for scheduled cesarean section with the following indication:Elective Repeat.  Membrane Rupture Time/Date: 1:38 PM ,05/26/2019   Patient delivered a Viable infant.05/26/2019  Details of operation can be found in separate operative note.  Pateint had an uncomplicated postpartum course.  She is ambulating, tolerating a regular diet, passing flatus, and urinating well. Patient is discharged home in stable condition on  05/28/19        Delivering PROVIDER: Olivia MackieAAVON, RICHARD                                                            Complications: None  Newborn Data: Live born female  Birth Weight: 8 lb 1.6 oz (3675 g) APGAR: 9, 9  Newborn Delivery   Birth date/time: 05/26/2019 13:39:00 Delivery type: C-Section, Low Transverse Trial of labor: No C-section categorization: Repeat      Baby Feeding: Breast Disposition:home with mother  Post partum procedures:none  Labs: Lab Results  Component Value Date   WBC 19.3 (H) 05/27/2019   HGB 8.5 (L) 05/27/2019   HCT  27.0 (L) 05/27/2019   MCV 75.0 (L) 05/27/2019   PLT 211 05/27/2019   CMP Latest Ref Rng & Units 11/21/2018  Glucose 70 - 99 mg/dL 409(W140(H)  BUN 6 - 20 mg/dL 8  Creatinine 1.190.44 - 1.471.00 mg/dL 8.290.57  Sodium 562135 - 130145 mmol/L 136  Potassium 3.5 - 5.1 mmol/L 3.2(L)  Chloride 98 - 111 mmol/L 104  CO2 22 - 32 mmol/L 19(L)  Calcium 8.9 - 10.3 mg/dL 9.3    Physical Exam @ time of discharge:  Vitals:   05/27/19 0833 05/27/19 1132 05/27/19 1430 05/28/19 0536  BP: (!) 103/59 (!) 116/56 118/65 110/74  Pulse: (!) 51 (!) 57 (!) 58 (!) 59  Resp: 18 18 17 18   Temp: 97.8 F (36.6 C) (!) 97.5 F (36.4 C) 97.7 F (36.5 C) 97.6 F (36.4 C)  TempSrc: Axillary Axillary Oral Oral  SpO2:    98%  Weight:      Height:       general: alert, cooperative and no distress lochia: appropriate uterine fundus: firm perineum: intact incision: Healing well with no significant drainage extremities: DVT Evaluation: No evidence of DVT seen  on physical exam.  Discharge instructions:  "Baby and Me Booklet" and [place] Discharge Medications:  Allergies as of 05/28/2019      Reactions   Peanut-containing Drug Products Anaphylaxis      Medication List    TAKE these medications   ibuprofen 800 MG tablet Commonly known as: ADVIL Take 1 tablet (800 mg total) by mouth every 6 (six) hours.   iron polysaccharides 150 MG capsule Commonly known as: NIFEREX Take 1 capsule (150 mg total) by mouth daily.   magnesium oxide 400 (241.3 Mg) MG tablet Commonly known as: MAG-OX Take 1 tablet (400 mg total) by mouth daily.   oxyCODONE-acetaminophen 5-325 MG tablet Commonly known as: PERCOCET/ROXICET Take 1 tablet by mouth every 6 (six) hours as needed for up to 5 days for moderate pain.   prenatal multivitamin Tabs tablet Take 1 tablet by mouth daily at 12 noon.            Discharge Care Instructions  (From admission, onward)         Start     Ordered   05/28/19 0000  Discharge wound care:     Comments: Leave honeycomb in place for 5 days - remove if get wet in shower. Leave steri-strips in place x 2 weeks. Keep incision clean and dry   05/28/19 0915         Diet: routine diet Activity: Advance as tolerated. Pelvic rest x 6 weeks.  Follow up:6 weeks  Signed: Artelia Laroche CNM, MSN, [facility] 05/28/2019, 9:16 AM

## 2019-06-10 DIAGNOSIS — Z5189 Encounter for other specified aftercare: Secondary | ICD-10-CM | POA: Diagnosis not present

## 2019-07-08 DIAGNOSIS — Z124 Encounter for screening for malignant neoplasm of cervix: Secondary | ICD-10-CM | POA: Diagnosis not present

## 2019-07-08 DIAGNOSIS — Z1151 Encounter for screening for human papillomavirus (HPV): Secondary | ICD-10-CM | POA: Diagnosis not present

## 2019-08-25 ENCOUNTER — Encounter (HOSPITAL_COMMUNITY): Payer: Self-pay

## 2020-02-17 IMAGING — CR DG CHEST 2V
1 series · 2 of 2 positions shown · non-contrast
Comparison: None.

CLINICAL DATA: Shortness of breath, wheezing, and productive cough

EXAM:
CHEST - 2 VIEW

[Series 1: dg chest 2 view · 0.14mm/px · 2 of 2 slices shown]
[im 1/2]
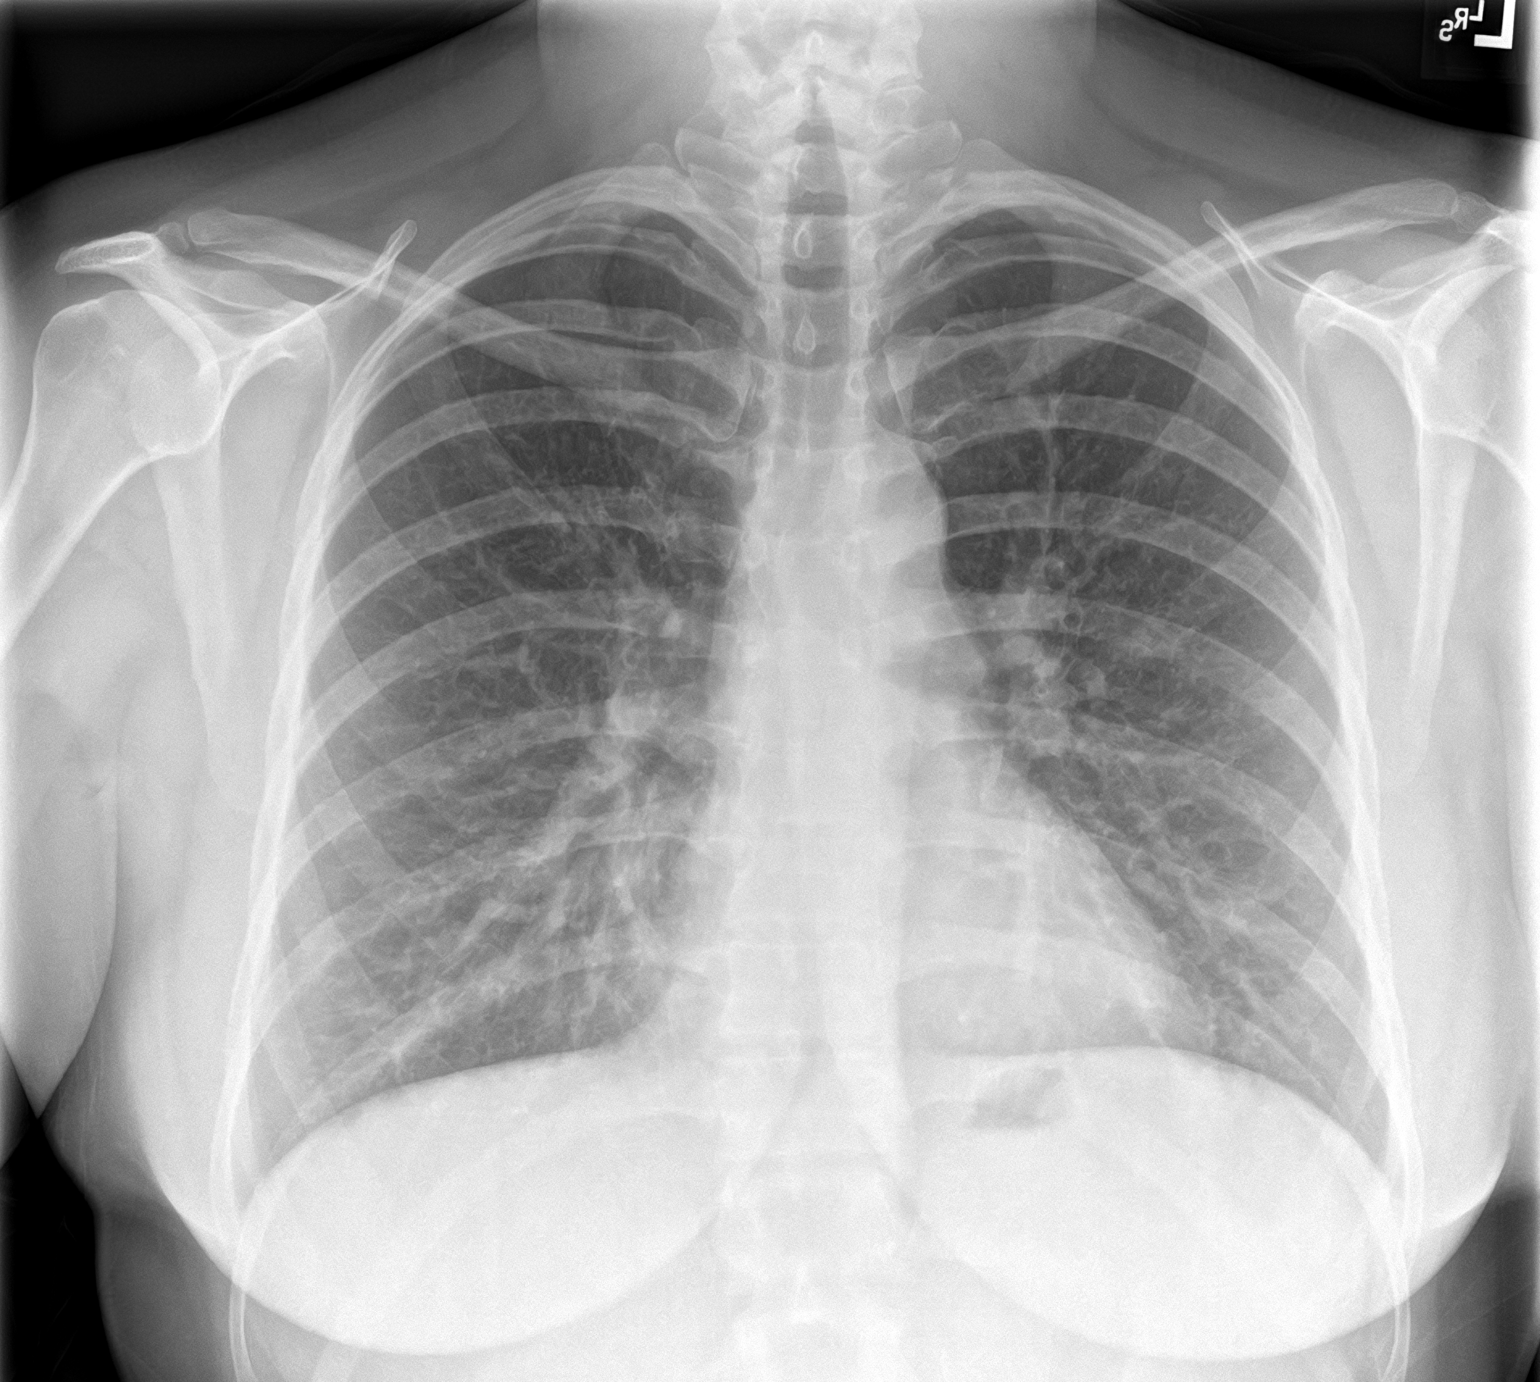
[im 2/2]
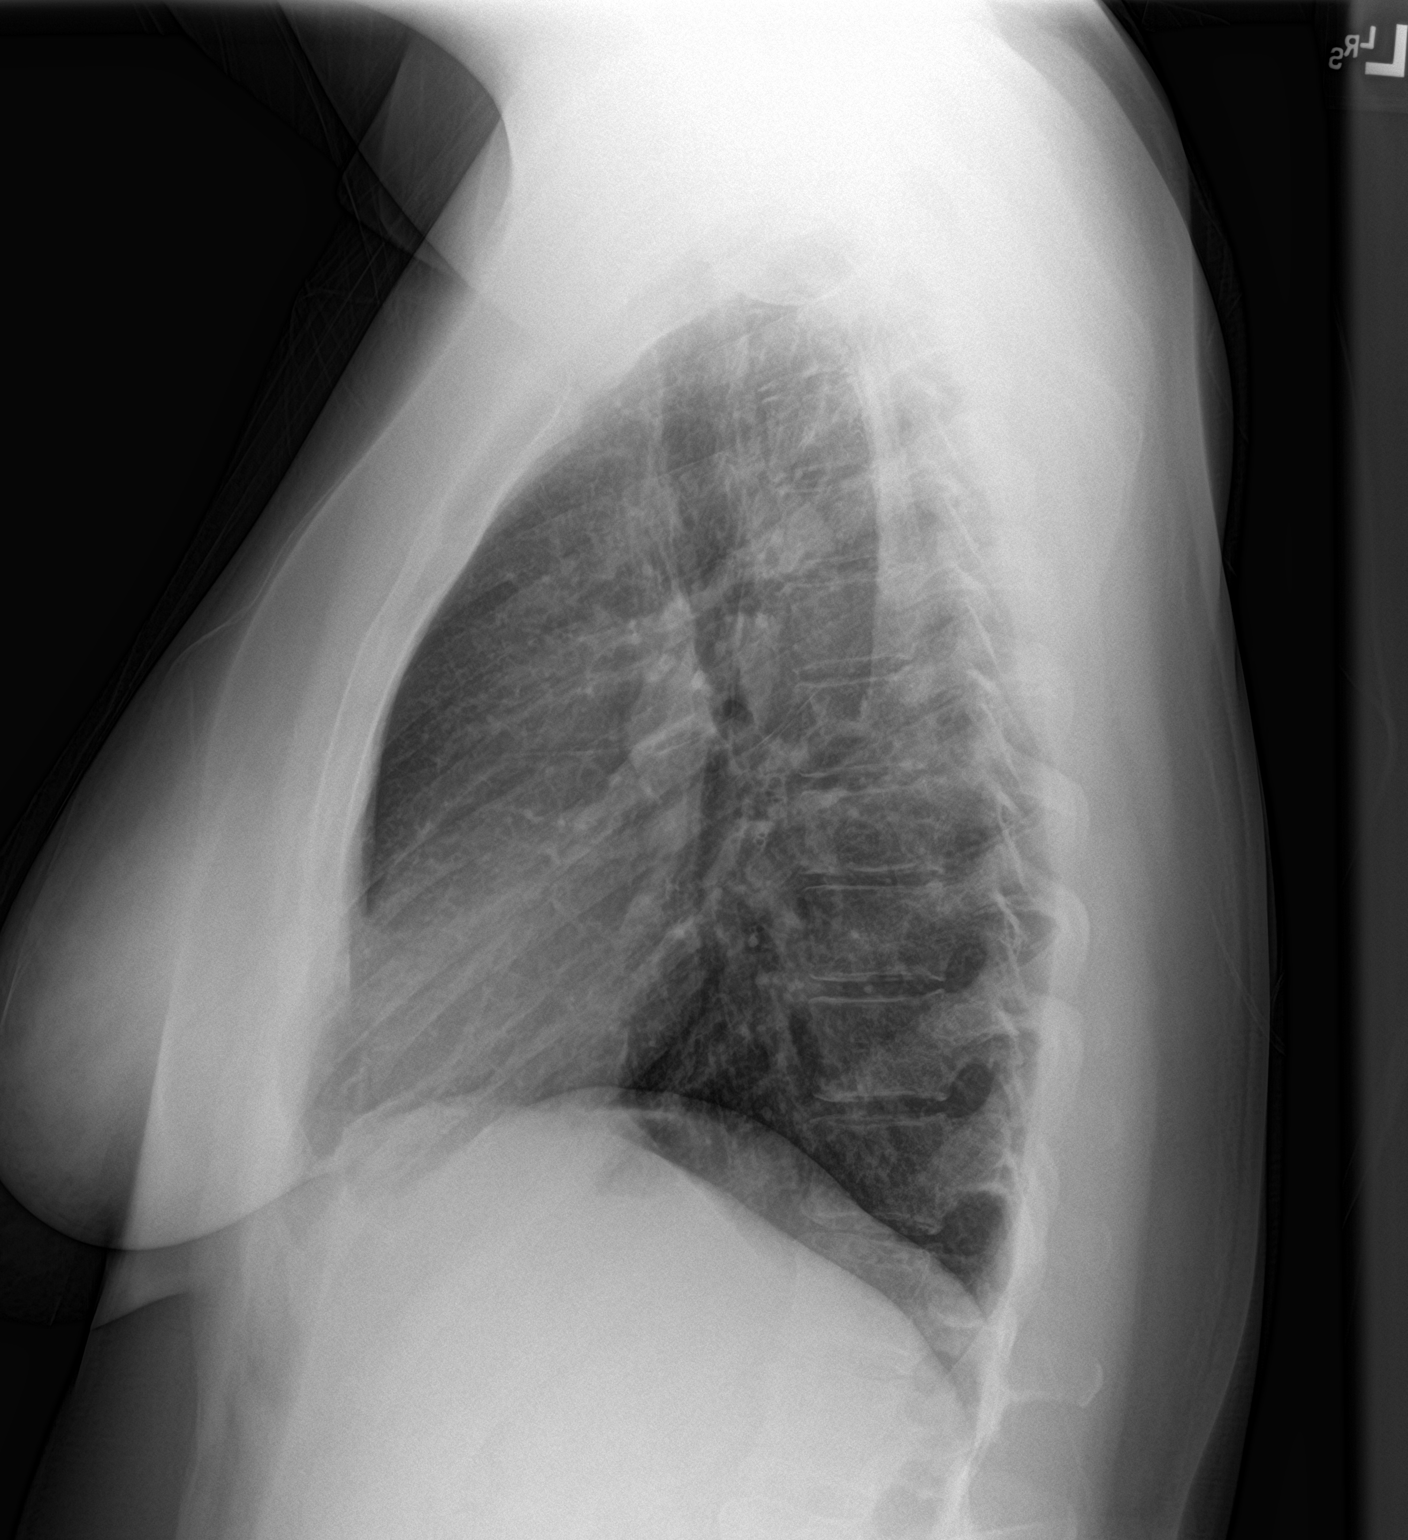

[2 of 2 positions shown; findings below may reference images not displayed]

FINDINGS: Mild central airway thickening. No airspace opacity. The lungs
appear otherwise clear. Cardiac and mediastinal margins appear
normal. No blunting of the costophrenic angles.
IMPRESSION: 1. Airway thickening is present, suggesting bronchitis or reactive
airways disease.

## 2020-04-24 DIAGNOSIS — B373 Candidiasis of vulva and vagina: Secondary | ICD-10-CM | POA: Diagnosis not present

## 2020-06-02 DIAGNOSIS — Z20822 Contact with and (suspected) exposure to covid-19: Secondary | ICD-10-CM | POA: Diagnosis not present

## 2020-10-22 DIAGNOSIS — F4320 Adjustment disorder, unspecified: Secondary | ICD-10-CM | POA: Diagnosis not present

## 2021-06-10 DIAGNOSIS — R519 Headache, unspecified: Secondary | ICD-10-CM | POA: Diagnosis not present

## 2021-06-10 DIAGNOSIS — M5416 Radiculopathy, lumbar region: Secondary | ICD-10-CM | POA: Diagnosis not present

## 2021-06-10 DIAGNOSIS — M9903 Segmental and somatic dysfunction of lumbar region: Secondary | ICD-10-CM | POA: Diagnosis not present

## 2021-06-10 DIAGNOSIS — M9901 Segmental and somatic dysfunction of cervical region: Secondary | ICD-10-CM | POA: Diagnosis not present

## 2021-06-11 DIAGNOSIS — M9901 Segmental and somatic dysfunction of cervical region: Secondary | ICD-10-CM | POA: Diagnosis not present

## 2021-06-11 DIAGNOSIS — M9903 Segmental and somatic dysfunction of lumbar region: Secondary | ICD-10-CM | POA: Diagnosis not present

## 2021-06-11 DIAGNOSIS — M5416 Radiculopathy, lumbar region: Secondary | ICD-10-CM | POA: Diagnosis not present

## 2021-06-11 DIAGNOSIS — R519 Headache, unspecified: Secondary | ICD-10-CM | POA: Diagnosis not present

## 2021-06-14 DIAGNOSIS — M9903 Segmental and somatic dysfunction of lumbar region: Secondary | ICD-10-CM | POA: Diagnosis not present

## 2021-06-14 DIAGNOSIS — M9901 Segmental and somatic dysfunction of cervical region: Secondary | ICD-10-CM | POA: Diagnosis not present

## 2021-06-14 DIAGNOSIS — M5416 Radiculopathy, lumbar region: Secondary | ICD-10-CM | POA: Diagnosis not present

## 2021-06-14 DIAGNOSIS — R519 Headache, unspecified: Secondary | ICD-10-CM | POA: Diagnosis not present

## 2021-06-15 DIAGNOSIS — M9903 Segmental and somatic dysfunction of lumbar region: Secondary | ICD-10-CM | POA: Diagnosis not present

## 2021-06-15 DIAGNOSIS — M5416 Radiculopathy, lumbar region: Secondary | ICD-10-CM | POA: Diagnosis not present

## 2021-06-15 DIAGNOSIS — R519 Headache, unspecified: Secondary | ICD-10-CM | POA: Diagnosis not present

## 2021-06-15 DIAGNOSIS — M9901 Segmental and somatic dysfunction of cervical region: Secondary | ICD-10-CM | POA: Diagnosis not present

## 2021-06-16 DIAGNOSIS — M9901 Segmental and somatic dysfunction of cervical region: Secondary | ICD-10-CM | POA: Diagnosis not present

## 2021-06-16 DIAGNOSIS — R519 Headache, unspecified: Secondary | ICD-10-CM | POA: Diagnosis not present

## 2021-06-16 DIAGNOSIS — M9903 Segmental and somatic dysfunction of lumbar region: Secondary | ICD-10-CM | POA: Diagnosis not present

## 2021-06-16 DIAGNOSIS — M5416 Radiculopathy, lumbar region: Secondary | ICD-10-CM | POA: Diagnosis not present

## 2021-06-21 DIAGNOSIS — R519 Headache, unspecified: Secondary | ICD-10-CM | POA: Diagnosis not present

## 2021-06-21 DIAGNOSIS — M9901 Segmental and somatic dysfunction of cervical region: Secondary | ICD-10-CM | POA: Diagnosis not present

## 2021-06-21 DIAGNOSIS — M9903 Segmental and somatic dysfunction of lumbar region: Secondary | ICD-10-CM | POA: Diagnosis not present

## 2021-06-21 DIAGNOSIS — M5416 Radiculopathy, lumbar region: Secondary | ICD-10-CM | POA: Diagnosis not present

## 2021-06-23 DIAGNOSIS — R519 Headache, unspecified: Secondary | ICD-10-CM | POA: Diagnosis not present

## 2021-06-23 DIAGNOSIS — M9901 Segmental and somatic dysfunction of cervical region: Secondary | ICD-10-CM | POA: Diagnosis not present

## 2021-06-23 DIAGNOSIS — M9903 Segmental and somatic dysfunction of lumbar region: Secondary | ICD-10-CM | POA: Diagnosis not present

## 2021-06-23 DIAGNOSIS — M5416 Radiculopathy, lumbar region: Secondary | ICD-10-CM | POA: Diagnosis not present

## 2021-06-24 DIAGNOSIS — M9901 Segmental and somatic dysfunction of cervical region: Secondary | ICD-10-CM | POA: Diagnosis not present

## 2021-06-24 DIAGNOSIS — M5416 Radiculopathy, lumbar region: Secondary | ICD-10-CM | POA: Diagnosis not present

## 2021-06-24 DIAGNOSIS — M9903 Segmental and somatic dysfunction of lumbar region: Secondary | ICD-10-CM | POA: Diagnosis not present

## 2021-06-24 DIAGNOSIS — R519 Headache, unspecified: Secondary | ICD-10-CM | POA: Diagnosis not present

## 2021-11-04 DIAGNOSIS — J020 Streptococcal pharyngitis: Secondary | ICD-10-CM | POA: Diagnosis not present

## 2022-01-07 DIAGNOSIS — N939 Abnormal uterine and vaginal bleeding, unspecified: Secondary | ICD-10-CM | POA: Diagnosis not present

## 2022-01-14 DIAGNOSIS — R319 Hematuria, unspecified: Secondary | ICD-10-CM | POA: Diagnosis not present

## 2022-01-18 DIAGNOSIS — B379 Candidiasis, unspecified: Secondary | ICD-10-CM | POA: Diagnosis not present

## 2022-01-18 DIAGNOSIS — R300 Dysuria: Secondary | ICD-10-CM | POA: Diagnosis not present

## 2022-02-09 ENCOUNTER — Encounter: Payer: Self-pay | Admitting: Nurse Practitioner

## 2022-02-09 ENCOUNTER — Ambulatory Visit: Payer: BC Managed Care – PPO | Admitting: Nurse Practitioner

## 2022-02-09 VITALS — BP 132/80 | HR 75 | Temp 97.8°F | Ht 68.08 in | Wt 184.0 lb

## 2022-02-09 DIAGNOSIS — R1013 Epigastric pain: Secondary | ICD-10-CM | POA: Diagnosis not present

## 2022-02-09 DIAGNOSIS — R319 Hematuria, unspecified: Secondary | ICD-10-CM

## 2022-02-09 LAB — POCT URINALYSIS DIPSTICK
Bilirubin, UA: NEGATIVE
Blood, UA: NEGATIVE
Glucose, UA: NEGATIVE
Ketones, UA: NEGATIVE
Leukocytes, UA: NEGATIVE
Nitrite, UA: NEGATIVE
Protein, UA: NEGATIVE
Spec Grav, UA: <=1.005 — AB (ref 1.010–1.025)
Urobilinogen, UA: 0.2 E.U./dL
pH, UA: 6.5 (ref 5.0–8.0)

## 2022-02-09 NOTE — Progress Notes (Signed)
? ? ?Careteam: ?Patient Care Team: ?Patient, No Pcp Per (Inactive) as PCP - General (General Practice) ? ?PLACE OF SERVICE:  ?[facility]  ?Advanced Directive information ?Does Patient Have a Medical Advance Directive?: No, Would patient like information on creating a medical advance directive?: No - Patient declined ? ?Allergies  ?Allergen Reactions  ? Peanut-Containing Drug Products Anaphylaxis  ? ? ?Chief Complaint  ?Patient presents with  ? Establish Care  ?  New patient to establish care. Pill not on any medications at this time  Discuss stomach issues. Patient feels full and disgusted at the thought of food, knot in stomach, and stomach ache. Patient has also had episodes of vomiting and diarrhea.   ? ? ? ?HPI: Patient is a 31 y.o. female to establish care.  ? ?She is not on any prescription medications.  ? ?She had seen her GYN 3 times in a 2 week span for medical issues. She had irregularly period and then UTI symptoms, (had blood in urine and painful urination) she then had a yeast infection.  ?After that then her GI issues started.  ?About 2 weeks ago sat down for dinner and was not hungry the next day she had diarrhea.  ?She has had chicken broth for the last 6-7 days.  ?She will throw up if she drinks anything other than water or chicken broth she will throw up.  ?Reports she feels a knot above her umbilicus.  ?The last time she ate a meal was over a week ago.  ?She struggles with constipation.  ?She generally goes every 7-8 days  ?Now she is going ever 3-4 days. Stool is not hard, no straining, stool is formed.  ?Has to sleep in the fetal position to relief  ? ?Reports she has lost 65 lbs over the last 1.5 years which has been gradual.  ?Weight has been stable over the last few months.  ? ?  ? ?Review of Systems:  ?Review of Systems  ?Constitutional:  Negative for chills, fever, malaise/fatigue and weight loss.  ?HENT:  Negative for tinnitus.   ?Respiratory:  Negative for cough, sputum production and  shortness of breath.   ?Cardiovascular:  Negative for chest pain, palpitations and leg swelling.  ?Gastrointestinal:  Positive for abdominal pain and vomiting. Negative for blood in stool, constipation, diarrhea and heartburn.  ?Genitourinary:  Negative for dysuria, frequency and urgency.  ?Skin: Negative.   ?Neurological:  Negative for dizziness and headaches.  ?Psychiatric/Behavioral:  Negative for depression and memory loss. The patient does not have insomnia.   ? ?Past Medical History:  ?Diagnosis Date  ? Complication of anesthesia   ? Gestational diabetes   ? PONV (postoperative nausea and vomiting)   ? ?Past Surgical History:  ?Procedure Laterality Date  ? BUNIONECTOMY    ? rt and left  ? CESAREAN SECTION N/A 05/23/2018  ? Procedure: CESAREAN SECTION;  Surgeon: Brien Few, MD;  Location: [place];  Service: Obstetrics;  Laterality: N/A;  ? CESAREAN SECTION WITH BILATERAL TUBAL LIGATION Bilateral 05/26/2019  ? Procedure: Repeat CESAREAN SECTION WITH BILATERAL TUBAL LIGATION;  Surgeon: Brien Few, MD;  Location: [place] LD [facility]S;  Service: Obstetrics;  Laterality: Bilateral;  EDD: 06/02/19 ?Allergy: Peanuts  ? ?Social History: ?  reports that she has never smoked. She has never used smokeless tobacco. She reports current alcohol use. She reports that she does not use drugs. ? ?Family History  ?Problem Relation Age of Onset  ? Hypertension Mother   ? Cancer Paternal Grandfather   ?  lung  ? ? ?Medications: ?Patient's Medications  ?New Prescriptions  ? No medications on file  ?Previous Medications  ? No medications on file  ?Modified Medications  ? No medications on file  ?Discontinued Medications  ? IBUPROFEN (ADVIL) 800 MG TABLET    Take 1 tablet (800 mg total) by mouth every 6 (six) hours.  ? IRON POLYSACCHARIDES (NIFEREX) 150 MG CAPSULE    Take 1 capsule (150 mg total) by mouth daily.  ? MAGNESIUM OXIDE (MAG-OX) 400 (241.3 MG) MG TABLET    Take 1 tablet (400 mg total) by mouth daily.  ? PRENATAL  VIT-FE FUMARATE-FA (PRENATAL MULTIVITAMIN) TABS TABLET    Take 1 tablet by mouth daily at 12 noon.  ? ? ?Physical Exam: ? ?Vitals:  ? 02/09/22 1344  ?BP: 132/80  ?Pulse: 75  ?Temp: 97.8 ?F (36.6 ?C)  ?TempSrc: Temporal  ?SpO2: 99%  ?Weight: 184 lb (83.5 kg)  ?Height: 5' 8.08" (1.729 m)  ? ?Body mass index is 27.92 kg/m?. ?Wt Readings from Last 3 Encounters:  ?02/09/22 184 lb (83.5 kg)  ?05/26/19 240 lb (108.9 kg)  ?05/17/19 240 lb (108.9 kg)  ? ? ?Physical Exam ?Constitutional:   ?   General: She is not in acute distress. ?   Appearance: She is well-developed. She is not diaphoretic.  ?HENT:  ?   Head: Normocephalic and atraumatic.  ?   Mouth/Throat:  ?   Pharynx: No oropharyngeal exudate.  ?Eyes:  ?   Conjunctiva/sclera: Conjunctivae normal.  ?   Pupils: Pupils are equal, round, and reactive to light.  ?Cardiovascular:  ?   Rate and Rhythm: Normal rate and regular rhythm.  ?   Heart sounds: Normal heart sounds.  ?Pulmonary:  ?   Effort: Pulmonary effort is normal.  ?   Breath sounds: Normal breath sounds.  ?Abdominal:  ?   General: Bowel sounds are normal.  ?   Palpations: Abdomen is soft.  ?   Tenderness: There is abdominal tenderness in the epigastric area and left lower quadrant. There is guarding.  ?Musculoskeletal:  ?   Cervical back: Normal range of motion and neck supple.  ?   Right lower leg: No edema.  ?   Left lower leg: No edema.  ?Skin: ?   General: Skin is warm and dry.  ?Neurological:  ?   Mental Status: She is alert.  ?Psychiatric:     ?   Mood and Affect: Mood normal.  ? ? ?Labs reviewed: ?Basic Metabolic Panel: ?No results for input(s): NA, K, CL, CO2, GLUCOSE, BUN, CREATININE, CALCIUM, MG, PHOS, TSH in the last 8760 hours. ?Liver Function Tests: ?No results for input(s): AST, ALT, ALKPHOS, BILITOT, PROT, ALBUMIN in the last 8760 hours. ?No results for input(s): LIPASE, AMYLASE in the last 8760 hours. ?No results for input(s): AMMONIA in the last 8760 hours. ?CBC: ?No results for input(s): WBC,  NEUTROABS, HGB, HCT, MCV, PLT in the last 8760 hours. ?Lipid Panel: ?No results for input(s): CHOL, HDL, LDLCALC, TRIG, CHOLHDL, LDLDIRECT in the last 8760 hours. ?TSH: ?No results for input(s): TSH in the last 8760 hours. ?A1C: ?No results found for: HGBA1C ? ? ?Assessment/Plan ?1. Epigastric pain ?-increase abdominal pain in epic gastric and LLQ ?-only able to tolerate liquid diet at this time.  ?-will get labs and imaging at this time ?-follow up precautions given and instructed to go to ED if symptoms worsen, fever, unable to tolerate liquids, or increase in pain occurs.  ?- CBC with Differential/Platelet ?-  CMP with eGFR([facility]) ?- Amylase ?- Lipase ?- CT Abdomen Pelvis W Contrast; Future ? ?2. Hematuria, unspecified type ?-recent UTI with hematuria, will follow up UA to ensure resolution  ?- POC Urinalysis Dipstick- normal.  ? ?Carlos American. Dewaine Oats, AGNP ? ?[facility] ?[phone]  ?

## 2022-02-10 LAB — COMPLETE METABOLIC PANEL WITH GFR
AG Ratio: 1.6 (calc) (ref 1.0–2.5)
ALT: 12 U/L (ref 6–29)
AST: 14 U/L (ref 10–30)
Albumin: 4.4 g/dL (ref 3.6–5.1)
Alkaline phosphatase (APISO): 69 U/L (ref 31–125)
BUN: 10 mg/dL (ref 7–25)
CO2: 24 mmol/L (ref 20–32)
Calcium: 9.5 mg/dL (ref 8.6–10.2)
Chloride: 106 mmol/L (ref 98–110)
Creat: 0.76 mg/dL (ref 0.50–0.97)
Globulin: 2.8 g/dL (calc) (ref 1.9–3.7)
Glucose, Bld: 82 mg/dL (ref 65–139)
Potassium: 4.2 mmol/L (ref 3.5–5.3)
Sodium: 140 mmol/L (ref 135–146)
Total Bilirubin: 0.3 mg/dL (ref 0.2–1.2)
Total Protein: 7.2 g/dL (ref 6.1–8.1)
eGFR: 107 mL/min/{1.73_m2} (ref >=60)

## 2022-02-10 LAB — CBC WITH DIFFERENTIAL/PLATELET
Absolute Monocytes: 543 cells/uL (ref 200–950)
Basophils Absolute: 49 cells/uL (ref 0–200)
Basophils Relative: 0.5 %
Eosinophils Absolute: 184 cells/uL (ref 15–500)
Eosinophils Relative: 1.9 %
HCT: 40.9 % (ref 35.0–45.0)
Hemoglobin: 13.3 g/dL (ref 11.7–15.5)
Lymphs Abs: 3017 cells/uL (ref 850–3900)
MCH: 27.4 pg (ref 27.0–33.0)
MCHC: 32.5 g/dL (ref 32.0–36.0)
MCV: 84.2 fL (ref 80.0–100.0)
MPV: 11.6 fL (ref 7.5–12.5)
Monocytes Relative: 5.6 %
Neutro Abs: 5907 cells/uL (ref 1500–7800)
Neutrophils Relative %: 60.9 %
Platelets: 226 10*3/uL (ref 140–400)
RBC: 4.86 10*6/uL (ref 3.80–5.10)
RDW: 12.9 % (ref 11.0–15.0)
Total Lymphocyte: 31.1 %
WBC: 9.7 10*3/uL (ref 3.8–10.8)

## 2022-02-10 LAB — AMYLASE: Amylase: 41 U/L (ref 21–101)

## 2022-02-10 LAB — LIPASE: Lipase: 35 U/L (ref 7–60)

## 2022-02-11 ENCOUNTER — Other Ambulatory Visit: Payer: 59

## 2022-02-11 ENCOUNTER — Telehealth: Payer: Self-pay | Admitting: *Deleted

## 2022-02-11 DIAGNOSIS — R1013 Epigastric pain: Secondary | ICD-10-CM

## 2022-02-11 DIAGNOSIS — R1032 Left lower quadrant pain: Secondary | ICD-10-CM

## 2022-02-11 NOTE — Telephone Encounter (Signed)
Gabrielle Hughes, referral team responded and stated that she called the [employer] and they DENIED the CT Scan.   Stated that a Provider has to call for a Peer to Peer and the number is: [phone]

## 2022-02-11 NOTE — Telephone Encounter (Signed)
Please obtain prior Authorization.

## 2022-02-11 NOTE — Telephone Encounter (Signed)
Message sent to Referral Team, Fayette Pho regarding Prior Authorization Status for CT Scan ordered by Shanda Bumps.   Fayette Pho replied Back wanting to know what Shanda Bumps is trying to rule out.    Please Advise.  Have sent Shanda Bumps a text asking but no reply.

## 2022-02-11 NOTE — Telephone Encounter (Signed)
Will have PCP Abbey Chatters, NP to obtain prior authorization.

## 2022-02-11 NOTE — Telephone Encounter (Signed)
They did try to obtain Prior Authorization but it has to be a Peer to Peer call from the Provider.

## 2022-02-14 ENCOUNTER — Other Ambulatory Visit: Payer: Self-pay | Admitting: Nurse Practitioner

## 2022-02-14 ENCOUNTER — Telehealth: Payer: Self-pay

## 2022-02-14 DIAGNOSIS — R1319 Other dysphagia: Secondary | ICD-10-CM

## 2022-02-14 DIAGNOSIS — R1013 Epigastric pain: Secondary | ICD-10-CM

## 2022-02-14 DIAGNOSIS — R1032 Left lower quadrant pain: Secondary | ICD-10-CM

## 2022-02-14 NOTE — Telephone Encounter (Signed)
Spoke with Gearldine Bienenstock at [facility] she is changing order to pelvis transabdominal only. Need to take out LLQ pain on abdominal complete. She will be sending order for cosign.

## 2022-02-14 NOTE — Telephone Encounter (Signed)
Will order stat ultrasound since CT is not getting approved.

## 2022-02-14 NOTE — Telephone Encounter (Signed)
I want an complete abdominal ultrasound. Spoke with radiologist to confirm order

## 2022-02-14 NOTE — Telephone Encounter (Signed)
Brandy with [facility] called and stated that the ultrasound order for LLQ pain needs to be ultrasound pelvic complete with transvaginal or without transvaginal. You will need to spcify on order.  Message routed to Sherrie Mustache, NP, High priority

## 2022-02-14 NOTE — Telephone Encounter (Signed)
LMOM for patient to return call.

## 2022-02-14 NOTE — Telephone Encounter (Signed)
Patient notified and agreed.  

## 2022-02-14 NOTE — Telephone Encounter (Signed)
No would like complete because I want to to include stomach due to epigastic pain and feeling like there is an obstruction in stomach. She also has LLQ pain, no need for transvaginal because she had transvaginal US done by GYN

## 2022-02-14 NOTE — Telephone Encounter (Signed)
Forwarded to [employer] per [employer].

## 2022-02-17 ENCOUNTER — Ambulatory Visit
Admission: RE | Admit: 2022-02-17 | Discharge: 2022-02-17 | Disposition: A | Discharge status: Home / Self Care | Payer: BC Managed Care – PPO | Source: Ambulatory Visit | Attending: Nurse Practitioner | Admitting: Nurse Practitioner

## 2022-02-17 DIAGNOSIS — R1013 Epigastric pain: Secondary | ICD-10-CM

## 2022-02-17 DIAGNOSIS — R1310 Dysphagia, unspecified: Secondary | ICD-10-CM | POA: Diagnosis not present

## 2022-02-17 DIAGNOSIS — R1032 Left lower quadrant pain: Secondary | ICD-10-CM

## 2022-02-17 DIAGNOSIS — R519 Headache, unspecified: Secondary | ICD-10-CM | POA: Diagnosis not present

## 2022-02-17 DIAGNOSIS — R1319 Other dysphagia: Secondary | ICD-10-CM

## 2022-02-17 DIAGNOSIS — M9903 Segmental and somatic dysfunction of lumbar region: Secondary | ICD-10-CM | POA: Diagnosis not present

## 2022-02-17 DIAGNOSIS — M9901 Segmental and somatic dysfunction of cervical region: Secondary | ICD-10-CM | POA: Diagnosis not present

## 2022-02-17 DIAGNOSIS — M5416 Radiculopathy, lumbar region: Secondary | ICD-10-CM | POA: Diagnosis not present

## 2022-02-17 DIAGNOSIS — K8020 Calculus of gallbladder without cholecystitis without obstruction: Secondary | ICD-10-CM | POA: Diagnosis not present

## 2022-02-23 DIAGNOSIS — M5416 Radiculopathy, lumbar region: Secondary | ICD-10-CM | POA: Diagnosis not present

## 2022-02-23 DIAGNOSIS — M9903 Segmental and somatic dysfunction of lumbar region: Secondary | ICD-10-CM | POA: Diagnosis not present

## 2022-02-23 DIAGNOSIS — R519 Headache, unspecified: Secondary | ICD-10-CM | POA: Diagnosis not present

## 2022-02-23 DIAGNOSIS — M9901 Segmental and somatic dysfunction of cervical region: Secondary | ICD-10-CM | POA: Diagnosis not present

## 2022-02-25 DIAGNOSIS — R519 Headache, unspecified: Secondary | ICD-10-CM | POA: Diagnosis not present

## 2022-02-25 DIAGNOSIS — M9901 Segmental and somatic dysfunction of cervical region: Secondary | ICD-10-CM | POA: Diagnosis not present

## 2022-02-25 DIAGNOSIS — M5416 Radiculopathy, lumbar region: Secondary | ICD-10-CM | POA: Diagnosis not present

## 2022-02-25 DIAGNOSIS — M9903 Segmental and somatic dysfunction of lumbar region: Secondary | ICD-10-CM | POA: Diagnosis not present

## 2022-02-28 DIAGNOSIS — M5416 Radiculopathy, lumbar region: Secondary | ICD-10-CM | POA: Diagnosis not present

## 2022-02-28 DIAGNOSIS — M9903 Segmental and somatic dysfunction of lumbar region: Secondary | ICD-10-CM | POA: Diagnosis not present

## 2022-02-28 DIAGNOSIS — M9901 Segmental and somatic dysfunction of cervical region: Secondary | ICD-10-CM | POA: Diagnosis not present

## 2022-02-28 DIAGNOSIS — R519 Headache, unspecified: Secondary | ICD-10-CM | POA: Diagnosis not present

## 2022-03-02 DIAGNOSIS — R519 Headache, unspecified: Secondary | ICD-10-CM | POA: Diagnosis not present

## 2022-03-02 DIAGNOSIS — M9903 Segmental and somatic dysfunction of lumbar region: Secondary | ICD-10-CM | POA: Diagnosis not present

## 2022-03-02 DIAGNOSIS — M9901 Segmental and somatic dysfunction of cervical region: Secondary | ICD-10-CM | POA: Diagnosis not present

## 2022-03-02 DIAGNOSIS — M5416 Radiculopathy, lumbar region: Secondary | ICD-10-CM | POA: Diagnosis not present

## 2022-03-05 DIAGNOSIS — M9901 Segmental and somatic dysfunction of cervical region: Secondary | ICD-10-CM | POA: Diagnosis not present

## 2022-03-05 DIAGNOSIS — M9903 Segmental and somatic dysfunction of lumbar region: Secondary | ICD-10-CM | POA: Diagnosis not present

## 2022-03-05 DIAGNOSIS — R519 Headache, unspecified: Secondary | ICD-10-CM | POA: Diagnosis not present

## 2022-03-05 DIAGNOSIS — M5416 Radiculopathy, lumbar region: Secondary | ICD-10-CM | POA: Diagnosis not present

## 2022-03-10 DIAGNOSIS — M9901 Segmental and somatic dysfunction of cervical region: Secondary | ICD-10-CM | POA: Diagnosis not present

## 2022-03-10 DIAGNOSIS — M9903 Segmental and somatic dysfunction of lumbar region: Secondary | ICD-10-CM | POA: Diagnosis not present

## 2022-03-10 DIAGNOSIS — M5416 Radiculopathy, lumbar region: Secondary | ICD-10-CM | POA: Diagnosis not present

## 2022-03-10 DIAGNOSIS — R519 Headache, unspecified: Secondary | ICD-10-CM | POA: Diagnosis not present

## 2022-03-22 DIAGNOSIS — M9901 Segmental and somatic dysfunction of cervical region: Secondary | ICD-10-CM | POA: Diagnosis not present

## 2022-03-22 DIAGNOSIS — M9903 Segmental and somatic dysfunction of lumbar region: Secondary | ICD-10-CM | POA: Diagnosis not present

## 2022-03-22 DIAGNOSIS — R519 Headache, unspecified: Secondary | ICD-10-CM | POA: Diagnosis not present

## 2022-03-22 DIAGNOSIS — M5416 Radiculopathy, lumbar region: Secondary | ICD-10-CM | POA: Diagnosis not present

## 2022-03-23 DIAGNOSIS — Z6829 Body mass index (BMI) 29.0-29.9, adult: Secondary | ICD-10-CM | POA: Diagnosis not present

## 2022-03-23 DIAGNOSIS — Z0142 Encounter for cervical smear to confirm findings of recent normal smear following initial abnormal smear: Secondary | ICD-10-CM | POA: Diagnosis not present

## 2022-03-23 DIAGNOSIS — Z01419 Encounter for gynecological examination (general) (routine) without abnormal findings: Secondary | ICD-10-CM | POA: Diagnosis not present

## 2022-04-04 DIAGNOSIS — M9901 Segmental and somatic dysfunction of cervical region: Secondary | ICD-10-CM | POA: Diagnosis not present

## 2022-04-04 DIAGNOSIS — M9903 Segmental and somatic dysfunction of lumbar region: Secondary | ICD-10-CM | POA: Diagnosis not present

## 2022-04-04 DIAGNOSIS — M5416 Radiculopathy, lumbar region: Secondary | ICD-10-CM | POA: Diagnosis not present

## 2022-04-04 DIAGNOSIS — R519 Headache, unspecified: Secondary | ICD-10-CM | POA: Diagnosis not present

## 2022-04-21 DIAGNOSIS — M5416 Radiculopathy, lumbar region: Secondary | ICD-10-CM | POA: Diagnosis not present

## 2022-04-21 DIAGNOSIS — R519 Headache, unspecified: Secondary | ICD-10-CM | POA: Diagnosis not present

## 2022-04-21 DIAGNOSIS — M9903 Segmental and somatic dysfunction of lumbar region: Secondary | ICD-10-CM | POA: Diagnosis not present

## 2022-04-21 DIAGNOSIS — M9901 Segmental and somatic dysfunction of cervical region: Secondary | ICD-10-CM | POA: Diagnosis not present

## 2022-05-13 DIAGNOSIS — M9903 Segmental and somatic dysfunction of lumbar region: Secondary | ICD-10-CM | POA: Diagnosis not present

## 2022-05-13 DIAGNOSIS — M9901 Segmental and somatic dysfunction of cervical region: Secondary | ICD-10-CM | POA: Diagnosis not present

## 2022-05-13 DIAGNOSIS — M5416 Radiculopathy, lumbar region: Secondary | ICD-10-CM | POA: Diagnosis not present

## 2022-05-13 DIAGNOSIS — R519 Headache, unspecified: Secondary | ICD-10-CM | POA: Diagnosis not present

## 2022-06-23 DIAGNOSIS — M9903 Segmental and somatic dysfunction of lumbar region: Secondary | ICD-10-CM | POA: Diagnosis not present

## 2022-06-23 DIAGNOSIS — R519 Headache, unspecified: Secondary | ICD-10-CM | POA: Diagnosis not present

## 2022-06-23 DIAGNOSIS — M9901 Segmental and somatic dysfunction of cervical region: Secondary | ICD-10-CM | POA: Diagnosis not present

## 2022-06-23 DIAGNOSIS — M5416 Radiculopathy, lumbar region: Secondary | ICD-10-CM | POA: Diagnosis not present

## 2022-10-06 DIAGNOSIS — M9901 Segmental and somatic dysfunction of cervical region: Secondary | ICD-10-CM | POA: Diagnosis not present

## 2022-10-06 DIAGNOSIS — R519 Headache, unspecified: Secondary | ICD-10-CM | POA: Diagnosis not present

## 2022-10-06 DIAGNOSIS — M9903 Segmental and somatic dysfunction of lumbar region: Secondary | ICD-10-CM | POA: Diagnosis not present

## 2022-10-06 DIAGNOSIS — M5416 Radiculopathy, lumbar region: Secondary | ICD-10-CM | POA: Diagnosis not present

## 2022-10-07 DIAGNOSIS — M9901 Segmental and somatic dysfunction of cervical region: Secondary | ICD-10-CM | POA: Diagnosis not present

## 2022-10-07 DIAGNOSIS — M5416 Radiculopathy, lumbar region: Secondary | ICD-10-CM | POA: Diagnosis not present

## 2022-10-07 DIAGNOSIS — R519 Headache, unspecified: Secondary | ICD-10-CM | POA: Diagnosis not present

## 2022-10-07 DIAGNOSIS — M9903 Segmental and somatic dysfunction of lumbar region: Secondary | ICD-10-CM | POA: Diagnosis not present

## 2022-10-11 DIAGNOSIS — M9903 Segmental and somatic dysfunction of lumbar region: Secondary | ICD-10-CM | POA: Diagnosis not present

## 2022-10-11 DIAGNOSIS — M9901 Segmental and somatic dysfunction of cervical region: Secondary | ICD-10-CM | POA: Diagnosis not present

## 2022-10-11 DIAGNOSIS — R519 Headache, unspecified: Secondary | ICD-10-CM | POA: Diagnosis not present

## 2022-10-11 DIAGNOSIS — M5416 Radiculopathy, lumbar region: Secondary | ICD-10-CM | POA: Diagnosis not present

## 2022-11-08 DIAGNOSIS — R519 Headache, unspecified: Secondary | ICD-10-CM | POA: Diagnosis not present

## 2022-11-08 DIAGNOSIS — M9901 Segmental and somatic dysfunction of cervical region: Secondary | ICD-10-CM | POA: Diagnosis not present

## 2022-11-08 DIAGNOSIS — M5416 Radiculopathy, lumbar region: Secondary | ICD-10-CM | POA: Diagnosis not present

## 2022-11-08 DIAGNOSIS — M9903 Segmental and somatic dysfunction of lumbar region: Secondary | ICD-10-CM | POA: Diagnosis not present

## 2022-12-08 DIAGNOSIS — M5416 Radiculopathy, lumbar region: Secondary | ICD-10-CM | POA: Diagnosis not present

## 2022-12-08 DIAGNOSIS — M9903 Segmental and somatic dysfunction of lumbar region: Secondary | ICD-10-CM | POA: Diagnosis not present

## 2022-12-08 DIAGNOSIS — M9901 Segmental and somatic dysfunction of cervical region: Secondary | ICD-10-CM | POA: Diagnosis not present

## 2022-12-08 DIAGNOSIS — R519 Headache, unspecified: Secondary | ICD-10-CM | POA: Diagnosis not present

## 2023-01-02 DIAGNOSIS — M5416 Radiculopathy, lumbar region: Secondary | ICD-10-CM | POA: Diagnosis not present

## 2023-01-02 DIAGNOSIS — M9901 Segmental and somatic dysfunction of cervical region: Secondary | ICD-10-CM | POA: Diagnosis not present

## 2023-01-02 DIAGNOSIS — R519 Headache, unspecified: Secondary | ICD-10-CM | POA: Diagnosis not present

## 2023-01-02 DIAGNOSIS — M9903 Segmental and somatic dysfunction of lumbar region: Secondary | ICD-10-CM | POA: Diagnosis not present

## 2023-01-23 DIAGNOSIS — M9901 Segmental and somatic dysfunction of cervical region: Secondary | ICD-10-CM | POA: Diagnosis not present

## 2023-01-23 DIAGNOSIS — M5416 Radiculopathy, lumbar region: Secondary | ICD-10-CM | POA: Diagnosis not present

## 2023-01-23 DIAGNOSIS — R519 Headache, unspecified: Secondary | ICD-10-CM | POA: Diagnosis not present

## 2023-01-23 DIAGNOSIS — M9903 Segmental and somatic dysfunction of lumbar region: Secondary | ICD-10-CM | POA: Diagnosis not present

## 2023-02-02 ENCOUNTER — Telehealth: Payer: Self-pay | Admitting: *Deleted

## 2023-02-02 ENCOUNTER — Telehealth (INDEPENDENT_AMBULATORY_CARE_PROVIDER_SITE_OTHER): Payer: BC Managed Care – PPO | Admitting: Nurse Practitioner

## 2023-02-02 VITALS — Ht 69.0 in | Wt 209.8 lb

## 2023-02-02 DIAGNOSIS — E669 Obesity, unspecified: Secondary | ICD-10-CM

## 2023-02-02 DIAGNOSIS — K5900 Constipation, unspecified: Secondary | ICD-10-CM | POA: Diagnosis not present

## 2023-02-02 DIAGNOSIS — Z6830 Body mass index (BMI) 30.0-30.9, adult: Secondary | ICD-10-CM | POA: Diagnosis not present

## 2023-02-02 DIAGNOSIS — E668 Other obesity: Secondary | ICD-10-CM | POA: Diagnosis not present

## 2023-02-02 NOTE — Progress Notes (Signed)
  This service is provided via telemedicine  No vital signs collected/recorded due to the encounter was a telemedicine visit.   Location of patient (ex: home, work):  Home  Patient consents to a telephone visit:  Yes  Location of the provider (ex: office, home):  Office [place].   Name of any referring provider:  na  Names of all persons participating in the telemedicine service and their role in the encounter:  Marrianne Mood, Patient, Nelda Severe, CMA, Abbey Chatters, NP  Time spent on call:  6:55

## 2023-02-02 NOTE — Progress Notes (Signed)
Careteam: Patient Care Team: Sharon Seller, NP as PCP - General (Geriatric Medicine)  Advanced Directive information Does Patient Have a Medical Advance Directive?: No, Would patient like information on creating a medical advance directive?: No - Patient declined  Allergies  Allergen Reactions   Peanut-Containing Drug Products Anaphylaxis    Chief Complaint  Patient presents with   Acute Visit    Requesting referral to Nutritionist.      HPI: Patient is a 32 y.o. female  Reports she has been working really hard at being healthy. Has been trying since January logging calorie intake- her weight has went down 3 lbs.  She currently weighs 209 lbs.  Every morning she wakes up and walks 2 miles- walks at a fast pace and does try to jog some.  Has done workout for women on you-tube and doing that. This is between 20-30 mins.  She will do a farlife protein shake.  Eating egg whites with chicken sausage  Does a [place] breast for dinner.  She is using a app to calc calories  Her clothes fit better.  She sleeps better and feels better.  Still does not go to the bathroom as much as she should.   Goal is that she wants to lose weight and keep it off vs fad diet.   Review of Systems:  Review of Systems  Constitutional:  Negative for chills, fever and weight loss.  HENT:  Negative for tinnitus.   Respiratory:  Negative for cough, sputum production and shortness of breath.   Cardiovascular:  Negative for chest pain, palpitations and leg swelling.  Gastrointestinal:  Positive for constipation. Negative for abdominal pain, diarrhea and heartburn.  Genitourinary:  Negative for dysuria, frequency and urgency.  Musculoskeletal:  Negative for back pain, falls, joint pain and myalgias.  Skin: Negative.   Neurological:  Negative for dizziness and headaches.  Psychiatric/Behavioral:  Negative for depression and memory loss. The patient does not have insomnia.     Past Medical  History:  Diagnosis Date   Complication of anesthesia    Gestational diabetes    PONV (postoperative nausea and vomiting)    Past Surgical History:  Procedure Laterality Date   BUNIONECTOMY     rt and left   CESAREAN SECTION N/A 05/23/2018   Procedure: CESAREAN SECTION;  Surgeon: Olivia Mackie, MD;  [facility]: [facility];  Service: Obstetrics;  Laterality: N/A;   CESAREAN SECTION WITH BILATERAL TUBAL LIGATION Bilateral 05/26/2019   Procedure: Repeat CESAREAN SECTION WITH BILATERAL TUBAL LIGATION;  Surgeon: Olivia Mackie, MD;  Location: [facility];  Service: Obstetrics;  Laterality: Bilateral;  EDD: 06/02/19 Allergy: Peanuts   Social History:   reports that she has never smoked. She has never used smokeless tobacco. She reports current alcohol use. She reports that she does not use drugs.  Family History  Problem Relation Age of Onset   Hypertension Mother    Cancer Paternal Grandfather        lung    Medications: Patient's Medications   No medications on file    Physical Exam:  Vitals:   02/02/23 0845  Weight: 209 lb 12.8 oz (95.2 kg)  Height: 5\' 9"  (1.753 m)   Body mass index is 30.98 kg/m. Wt Readings from Last 3 Encounters:  02/09/22 184 lb (83.5 kg)  05/26/19 240 lb (108.9 kg)  05/17/19 240 lb (108.9 kg)    Physical Exam Constitutional:      Appearance: Normal appearance.  Pulmonary:  Effort: Pulmonary effort is normal.  Neurological:     Mental Status: She is alert and oriented to person, place, and time. Mental status is at baseline.  Psychiatric:        Mood and Affect: Mood normal.     Labs reviewed: Basic Metabolic Panel: Recent Labs    02/09/22 1419  NA 140  K 4.2  CL 106  CO2 24  GLUCOSE 82  BUN 10  CREATININE 0.76  CALCIUM 9.5   Liver Function Tests: Recent Labs    02/09/22 1419  AST 14  ALT 12  BILITOT 0.3  PROT 7.2   Recent Labs    02/09/22 1419  LIPASE 35  AMYLASE 41   No results for input(s): "AMMONIA" in  the last 8760 hours. CBC: Recent Labs    02/09/22 1419  WBC 9.7  NEUTROABS 5,907  HGB 13.3  HCT 40.9  MCV 84.2  PLT 226   Lipid Panel: No results for input(s): "CHOL", "HDL", "LDLCALC", "TRIG", "CHOLHDL", "LDLDIRECT" in the last 8760 hours. TSH: No results for input(s): "TSH" in the last 8760 hours. A1C: No results found for: "HGBA1C"   Assessment/Plan .1. BMI 30.0-30.9,adult - Amb ref to Medical Nutrition Therapy-MNT  2. Class 1 obesity without serious comorbidity with body mass index (BMI) of 30.0 to 30.9 in adult, unspecified obesity type -continues to work on weight loss through exercise and proper nutrition but needing help with what she is eating, would like nutritionist referral to help her sustain healthy lifestyle  - Amb ref to Medical Nutrition Therapy-MNT  To schedule CPE in office with labs day of appt  Cailie Bosshart K. Biagio Borg  [facility] [phone]    Virtual Visit via video  I connected with patient on 02/02/23 at  8:20 AM EDT by mychart video and verified that I am speaking with the correct person using two identifiers.  Location: Provider: [place]   I discussed the limitations, risks, security and privacy concerns of performing an evaluation and management service by telephone and the availability of in person appointments. I also discussed with the patient that there may be a patient responsible charge related to this service. The patient expressed understanding and agreed to proceed.   I discussed the assessment and treatment plan with the patient. The patient was provided an opportunity to ask questions and all were answered. The patient agreed with the plan and demonstrated an understanding of the instructions.   The patient was advised to call back or seek an in-person evaluation if the symptoms worsen or if the condition fails to improve as anticipated. / I provided 18 minutes of non-face-to-face time during this  encounter.  Janene Harvey. Biagio Borg Avs printed and mailed

## 2023-02-02 NOTE — Telephone Encounter (Signed)
Ms. Gabrielle Hughes, Gabrielle Hughes are scheduled for a virtual visit with your provider today.    Just as we do with appointments in the office, we must obtain your consent to participate.  Your consent will be active for this visit and any virtual visit you Gabrielle Hughes have with one of our providers in the next 365 days.    If you have a MyChart account, I can also send a copy of this consent to you electronically.  All virtual visits are billed to your insurance company just like a traditional visit in the office.  As this is a virtual visit, video technology does not allow for your provider to perform a traditional examination.  [name].  If your provider identifies any concerns that need to be evaluated in person or the need to arrange testing such as labs, EKG, etc, we will make arrangements to do so.    Although advances in technology are sophisticated, we cannot ensure that it will always work on either your end or our end.  If the connection with a video visit is poor, we Gabrielle Hughes have to switch to a telephone visit.  With either a video or telephone visit, we are not always able to ensure that we have a secure connection.   I need to obtain your verbal consent now.   Are you willing to proceed with your visit today?   Gabrielle Hughes has provided verbal consent on 02/02/2023 for a virtual visit (video or telephone).   Gabrielle Hughes, [place] 02/02/2023  8:13 AM

## 2023-02-21 DIAGNOSIS — M5416 Radiculopathy, lumbar region: Secondary | ICD-10-CM | POA: Diagnosis not present

## 2023-02-21 DIAGNOSIS — M9901 Segmental and somatic dysfunction of cervical region: Secondary | ICD-10-CM | POA: Diagnosis not present

## 2023-02-21 DIAGNOSIS — R519 Headache, unspecified: Secondary | ICD-10-CM | POA: Diagnosis not present

## 2023-02-21 DIAGNOSIS — M9903 Segmental and somatic dysfunction of lumbar region: Secondary | ICD-10-CM | POA: Diagnosis not present

## 2023-03-24 ENCOUNTER — Ambulatory Visit (INDEPENDENT_AMBULATORY_CARE_PROVIDER_SITE_OTHER): Payer: BC Managed Care – PPO | Admitting: Nurse Practitioner

## 2023-03-24 ENCOUNTER — Encounter: Payer: Self-pay | Admitting: Nurse Practitioner

## 2023-03-24 VITALS — BP 132/84 | HR 60 | Temp 97.3°F | Ht 68.5 in | Wt 215.0 lb

## 2023-03-24 DIAGNOSIS — Z0000 Encounter for general adult medical examination without abnormal findings: Secondary | ICD-10-CM

## 2023-03-24 DIAGNOSIS — Z13220 Encounter for screening for lipoid disorders: Secondary | ICD-10-CM | POA: Diagnosis not present

## 2023-03-24 LAB — CBC WITH DIFFERENTIAL/PLATELET
Absolute Monocytes: 490 cells/uL (ref 200–950)
Basophils Absolute: 28 cells/uL (ref 0–200)
Basophils Relative: 0.4 %
Eosinophils Absolute: 98 cells/uL (ref 15–500)
Eosinophils Relative: 1.4 %
HCT: 39.3 % (ref 35.0–45.0)
Hemoglobin: 12.5 g/dL (ref 11.7–15.5)
Lymphs Abs: 1981 cells/uL (ref 850–3900)
MCH: 24.9 pg — ABNORMAL LOW (ref 27.0–33.0)
MCHC: 31.8 g/dL — ABNORMAL LOW (ref 32.0–36.0)
MCV: 78.3 fL — ABNORMAL LOW (ref 80.0–100.0)
MPV: 12.9 fL — ABNORMAL HIGH (ref 7.5–12.5)
Monocytes Relative: 7 %
Neutro Abs: 4403 cells/uL (ref 1500–7800)
Neutrophils Relative %: 62.9 %
Platelets: 254 10*3/uL (ref 140–400)
RBC: 5.02 10*6/uL (ref 3.80–5.10)
RDW: 13.6 % (ref 11.0–15.0)
Total Lymphocyte: 28.3 %
WBC: 7 10*3/uL (ref 3.8–10.8)

## 2023-03-24 LAB — COMPLETE METABOLIC PANEL WITH GFR
AG Ratio: 1.5 (calc) (ref 1.0–2.5)
ALT: 19 U/L (ref 6–29)
AST: 16 U/L (ref 10–30)
Albumin: 4.5 g/dL (ref 3.6–5.1)
Alkaline phosphatase (APISO): 87 U/L (ref 31–125)
BUN: 15 mg/dL (ref 7–25)
CO2: 26 mmol/L (ref 20–32)
Calcium: 9.5 mg/dL (ref 8.6–10.2)
Chloride: 104 mmol/L (ref 98–110)
Creat: 0.77 mg/dL (ref 0.50–0.97)
Globulin: 3.1 g/dL (calc) (ref 1.9–3.7)
Glucose, Bld: 90 mg/dL (ref 65–99)
Potassium: 4.5 mmol/L (ref 3.5–5.3)
Sodium: 137 mmol/L (ref 135–146)
Total Bilirubin: 0.4 mg/dL (ref 0.2–1.2)
Total Protein: 7.6 g/dL (ref 6.1–8.1)
eGFR: 104 mL/min/{1.73_m2} (ref >=60)

## 2023-03-24 LAB — LIPID PANEL
Cholesterol: 136 mg/dL (ref <200)
HDL: 50 mg/dL (ref >=50)
LDL Cholesterol (Calc): 71 mg/dL (calc)
Non-HDL Cholesterol (Calc): 86 mg/dL (calc) (ref <130)
Total CHOL/HDL Ratio: 2.7 (calc) (ref <5.0)
Triglycerides: 73 mg/dL (ref <150)

## 2023-03-24 NOTE — Patient Instructions (Signed)

## 2023-03-24 NOTE — Progress Notes (Signed)
Provider: Sharon Seller, NP  Patient Care Team: Sharon Seller, NP as PCP - General (Geriatric Medicine)  Extended Emergency Contact Information Primary Emergency Contact: [facility] Phone: [phone] Relation: Spouse Allergies  Allergen Reactions   Peanut-Containing Drug Products Anaphylaxis   Code Status: FULL  Goals of Care: Advanced Directive information    03/24/2023    9:51 AM  Advanced Directives  Does Patient Have a Medical Advance Directive? No  Would patient like information on creating a medical advance directive? No - Patient declined     Chief Complaint  Patient presents with   Annual Exam    Yearly physical. Discuss need for covid boosters, td/tdap (today), hep c screening, and pap(has GYN, Dr.Tavvon, Richard) . Patient had coffee with almond milk this morning.     HPI: Patient is a 32 y.o. female seen in today for an wellness exam at [facility] Walking 3 miles every morning and does weights at night She is unable to lose weight but plans to meet with the nutritionist to see what else she can do.  She is doing my fitness pal app to monitor her intake.  Bowels moving well       03/24/2023    9:50 AM 02/09/2022    1:43 PM 04/06/2018    7:32 AM  Depression screen PHQ 2/9  Decreased Interest 0 0 0  Down, Depressed, Hopeless 0 0 0  PHQ - 2 Score 0 0 0       05/27/2019    8:33 AM 05/27/2019    9:14 PM 05/28/2019    8:23 AM 02/09/2022    1:43 PM 03/24/2023    9:50 AM  Fall Risk  Falls in the past year?    0 0  Was there an injury with Fall?    0 0  Fall Risk Category Calculator    0 0  Fall Risk Category (Retired)    Low   (RETIRED) Patient Fall Risk Level Low fall risk Low fall risk Low fall risk Low fall risk   Patient at Risk for Falls Due to    No Fall Risks No Fall Risks  Fall risk Follow up    Falls evaluation completed Falls evaluation completed       No data to display           Health Maintenance  Topic Date Due    Hepatitis C Screening  Never done   DTaP/Tdap/Td (1 - Tdap) Never done   PAP SMEAR-Modifier  Never done   COVID-19 Vaccine (3 - 2023-24 season) 05/27/2022   INFLUENZA VACCINE  04/27/2023   HIV Screening  Completed   HPV VACCINES  Aged Out    Past Medical History:  Diagnosis Date   Complication of anesthesia    Gestational diabetes    PONV (postoperative nausea and vomiting)     Past Surgical History:  Procedure Laterality Date   BUNIONECTOMY     rt and left   CESAREAN SECTION N/A 05/23/2018   Procedure: CESAREAN SECTION;  Surgeon: Olivia Mackie, MD;  Location: [facility];  Service: Obstetrics;  Laterality: N/A;   CESAREAN SECTION WITH BILATERAL TUBAL LIGATION Bilateral 05/26/2019   Procedure: Repeat CESAREAN SECTION WITH BILATERAL TUBAL LIGATION;  Surgeon: Olivia Mackie, MD;  Location: [facility];  Service: Obstetrics;  Laterality: Bilateral;  EDD: 06/02/19 Allergy: Peanuts    Social History   Socioeconomic History   Marital status: Married    Spouse name: Not on file  Number of children: Not on file   Years of education: Not on file   Highest education level: Not on file  Occupational History   Not on file  Tobacco Use   Smoking status: Never   Smokeless tobacco: Never  Vaping Use   Vaping Use: Never used  Substance and Sexual Activity   Alcohol use: Yes    Comment: socially   Drug use: Never   Sexual activity: Not Currently    Birth control/protection: Surgical    Comment: tubial ligation  Other Topics Concern   Not on file  Social History Narrative   Not on file   Social Determinants of Health   Financial Resource Strain: Low Risk  (05/16/2018)   Overall Financial Resource Strain (CARDIA)    Difficulty of Paying Living Expenses: Not hard at all  Food Insecurity: No Food Insecurity (05/16/2018)   Hunger Vital Sign    Worried About Running Out of Food in the Last Year: Never true    Ran Out of Food in the Last Year: Never true  Transportation  Needs: No Transportation Needs (05/16/2018)   PRAPARE - Administrator, Civil Service (Medical): No    Lack of Transportation (Non-Medical): No  Physical Activity: Inactive (05/16/2018)   Exercise Vital Sign    Days of Exercise per Week: 0 days    Minutes of Exercise per Session: 0 min  Stress: No Stress Concern Present (05/16/2018)   [employer] of Occupational Health - Occupational Stress Questionnaire    Feeling of Stress : Not at all  Social Connections: Not on file    Family History  Problem Relation Age of Onset   Hypertension Mother    Cancer Paternal Grandfather        lung    Review of Systems:  Review of Systems  Constitutional:  Negative for chills, fever and weight loss.  HENT:  Negative for tinnitus.   Respiratory:  Negative for cough, sputum production and shortness of breath.   Cardiovascular:  Negative for chest pain, palpitations and leg swelling.  Gastrointestinal:  Negative for abdominal pain, constipation, diarrhea and heartburn.  Genitourinary:  Negative for dysuria, frequency and urgency.  Musculoskeletal:  Negative for back pain, falls, joint pain and myalgias.  Skin: Negative.   Neurological:  Negative for dizziness and headaches.  Psychiatric/Behavioral:  Negative for depression and memory loss. The patient does not have insomnia.      Allergies as of 03/24/2023       Reactions   Peanut-containing Drug Products Anaphylaxis        Medication List    as of March 24, 2023 10:06 AM   You have not been prescribed any medications.       Physical Exam: Vitals:   03/24/23 0949  BP: 132/84  Pulse: 60  Temp: (!) 97.3 F (36.3 C)  TempSrc: Temporal  SpO2: 98%  Weight: 215 lb (97.5 kg)  Height: 5' 8.5" (1.74 m)   Body mass index is 32.22 kg/m. Wt Readings from Last 3 Encounters:  03/24/23 215 lb (97.5 kg)  02/02/23 209 lb 12.8 oz (95.2 kg)  02/09/22 184 lb (83.5 kg)    Physical Exam Constitutional:      General: She  is not in acute distress.    Appearance: She is well-developed. She is not diaphoretic.  HENT:     Head: Normocephalic and atraumatic.     Mouth/Throat:     Pharynx: No oropharyngeal exudate.  Eyes:  Conjunctiva/sclera: Conjunctivae normal.     Pupils: Pupils are equal, round, and reactive to light.  Cardiovascular:     Rate and Rhythm: Normal rate and regular rhythm.     Heart sounds: Normal heart sounds.  Pulmonary:     Effort: Pulmonary effort is normal.     Breath sounds: Normal breath sounds.  Abdominal:     General: Bowel sounds are normal.     Palpations: Abdomen is soft.  Musculoskeletal:     Cervical back: Normal range of motion and neck supple.     Right lower leg: No edema.     Left lower leg: No edema.  Skin:    General: Skin is warm and dry.  Neurological:     Mental Status: She is alert.  Psychiatric:        Mood and Affect: Mood normal.     Labs reviewed: Basic Metabolic Panel: No results for input(s): "NA", "K", "CL", "CO2", "GLUCOSE", "BUN", "CREATININE", "CALCIUM", "MG", "PHOS", "TSH" in the last 8760 hours. Liver Function Tests: No results for input(s): "AST", "ALT", "ALKPHOS", "BILITOT", "PROT", "ALBUMIN" in the last 8760 hours. No results for input(s): "LIPASE", "AMYLASE" in the last 8760 hours. No results for input(s): "AMMONIA" in the last 8760 hours. CBC: No results for input(s): "WBC", "NEUTROABS", "HGB", "HCT", "MCV", "PLT" in the last 8760 hours. Lipid Panel: No results for input(s): "CHOL", "HDL", "LDLCALC", "TRIG", "CHOLHDL", "LDLDIRECT" in the last 8760 hours. No results found for: "HGBA1C"  Procedures: No results found.  Assessment/Plan 1. Routine general medical examination at a health care facility -wellness visit completed today, The patient was counseled regarding the appropriate use of alcohol, regular self-examination of the breasts on a monthly basis, prevention of dental and periodontal disease, diet, regular sustained  exercise for at least 30 minutes 5 times per week, routine screening interval for mammogram as recommended by the American Cancer Society and ACOG, importance of regular PAP smears, and recommended schedule for GI hemoccult testing, colonoscopy, cholesterol, thyroid and diabetes screening. - Lipid panel - COMPLETE METABOLIC PANEL WITH GFR - CBC with Differential/Platelet   Next appt: yearly, labs at appt.  Gabrielle Hughes. Biagio Borg  [facility] [phone]

## 2023-05-03 DIAGNOSIS — D2372 Other benign neoplasm of skin of left lower limb, including hip: Secondary | ICD-10-CM | POA: Diagnosis not present

## 2023-05-03 DIAGNOSIS — D239 Other benign neoplasm of skin, unspecified: Secondary | ICD-10-CM | POA: Diagnosis not present

## 2023-05-04 ENCOUNTER — Ambulatory Visit: Payer: BC Managed Care – PPO | Admitting: Dietician

## 2023-05-16 IMAGING — RF DG ESOPHAGUS
8 of 11 series · 13 of 24 positions shown · non-contrast
Comparison: None Available.

CLINICAL DATA: difficulty swallowing and passing food

EXAM:
UPPER GI SERIES WITHOUT KUB
TECHNIQUE: Routine upper GI series was performed with thin and high density
barium.
FLUOROSCOPY:
Radiation Exposure Index (as provided by the fluoroscopic device):
3.4 minutes (28 mGy)

[Series 1: fluoro_barium 2fps_bw · 2 of 8 frames shown (1 of 2)]
[frame 2/8]
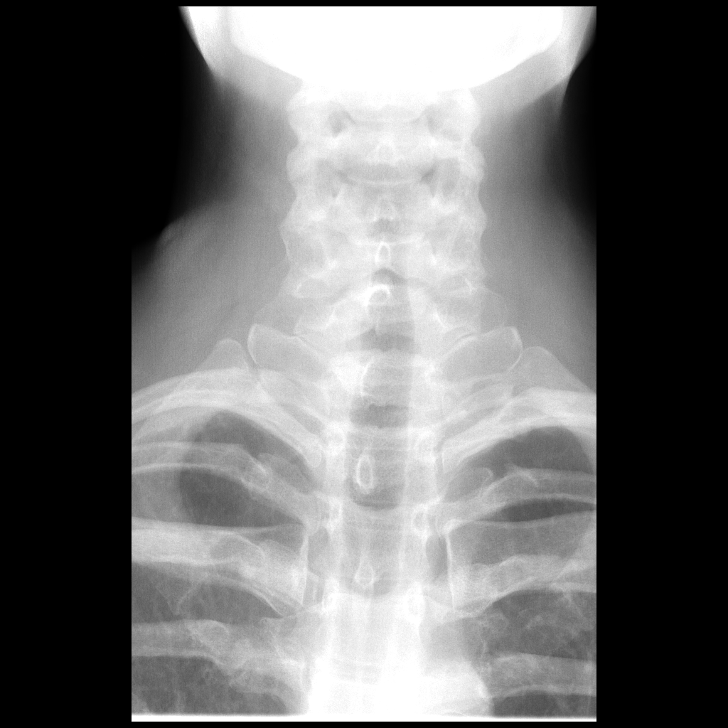
[frame 7/8]
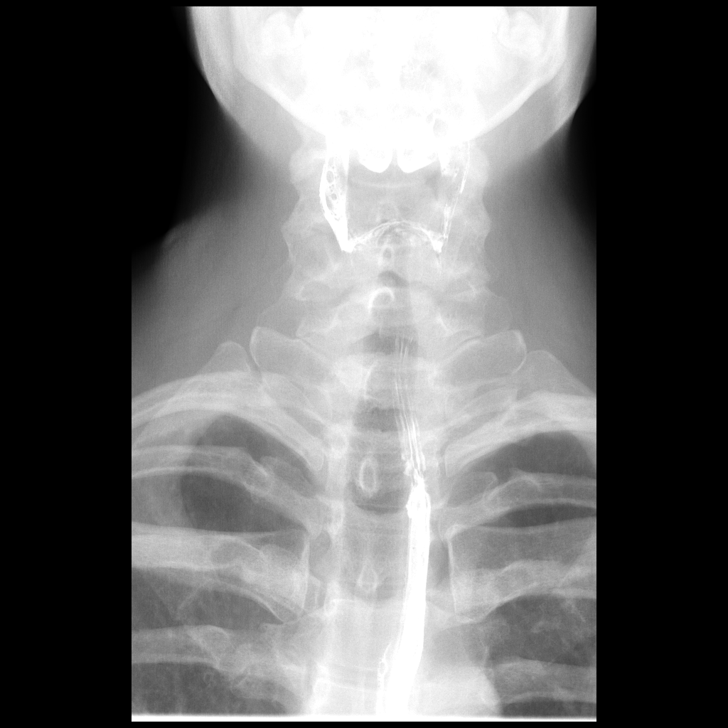

[Series 3: fluoro_barium 2fps_bw · 2 of 10 frames shown (2 of 2)]
[frame 1/10]
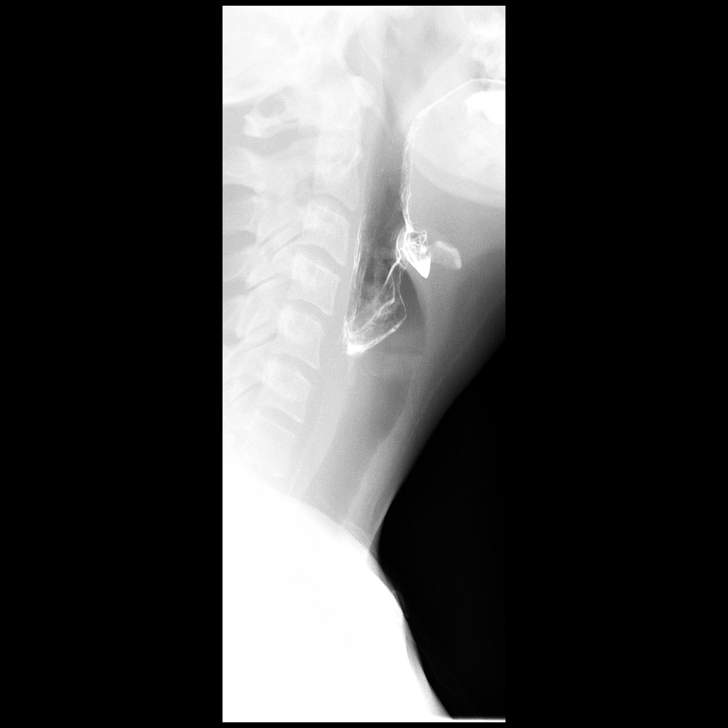
[frame 9/10]
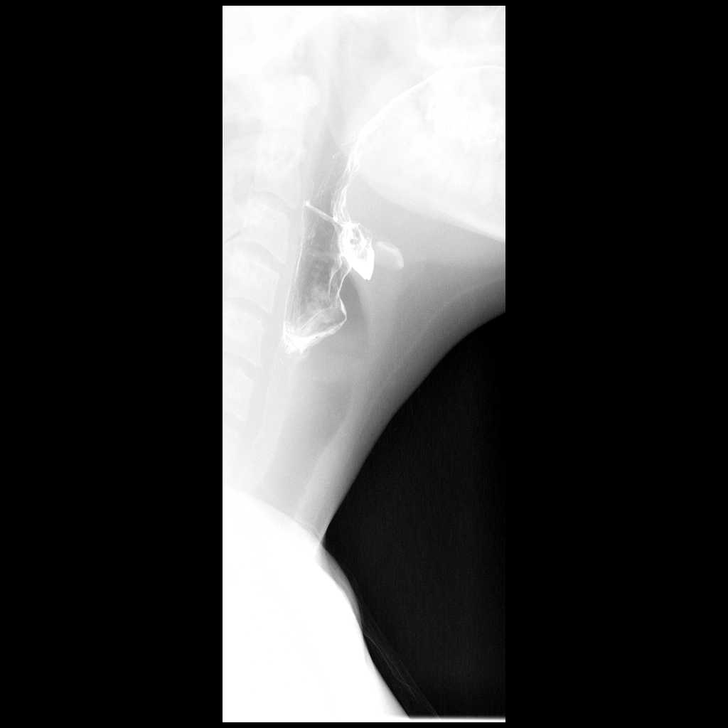

[Series 5: cp_standard · 2 of 74 frames shown (1 of 6)]
[frame 6/74]
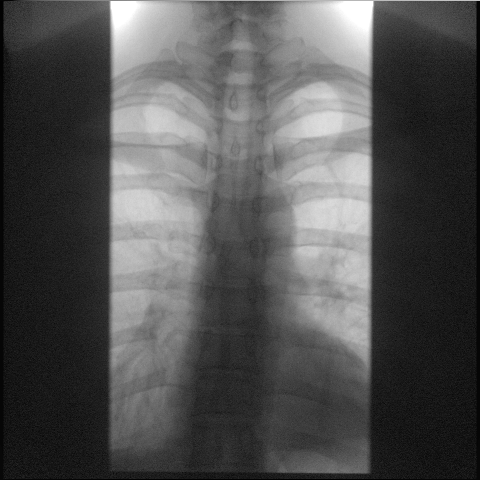
[frame 63/74]
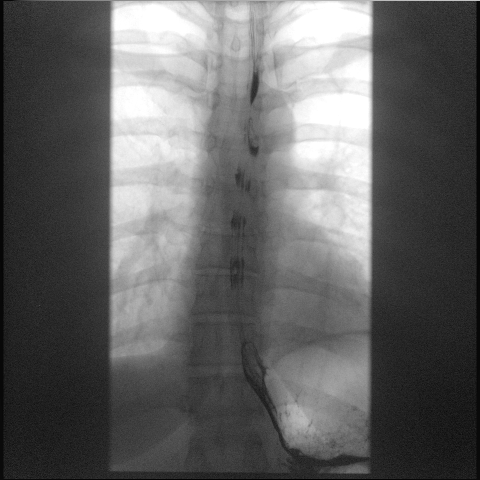

[Series 6: cp_standard · 2 of 54 frames shown (2 of 6)]
[frame 28/54]
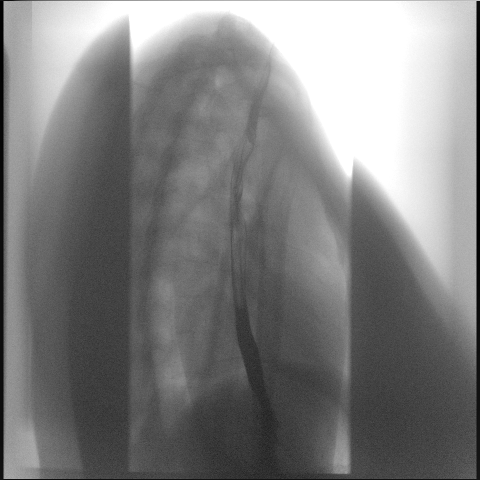
[frame 46/54]
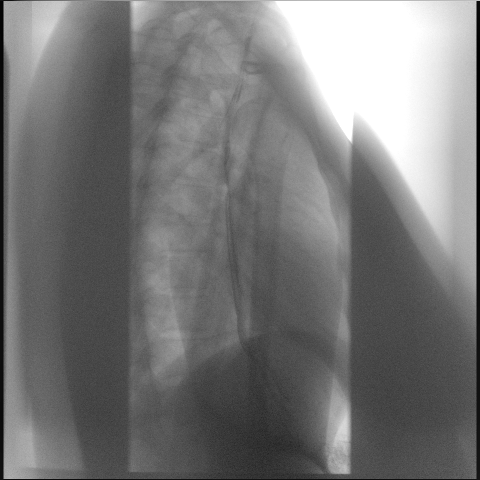

[Series 7: cp_standard · 2 of 143 frames shown (3 of 6)]
[frame 72/143]
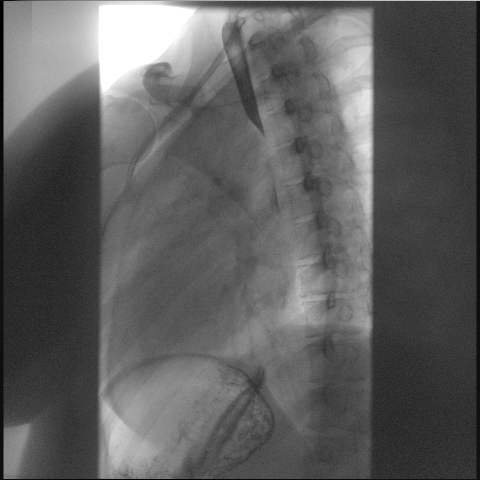
[frame 127/143]
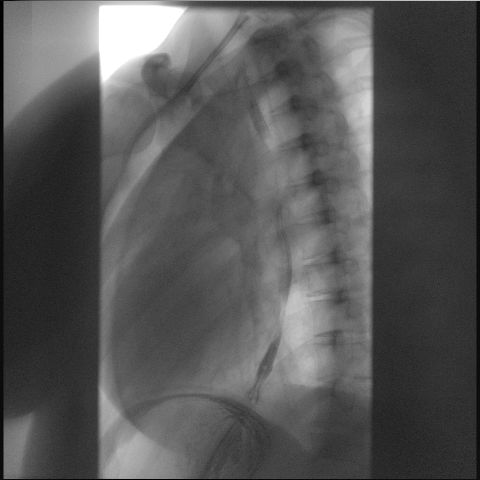

[Series 10: cp_standard · 1 of 1 slices shown (4 of 6)]
[im 1/1]
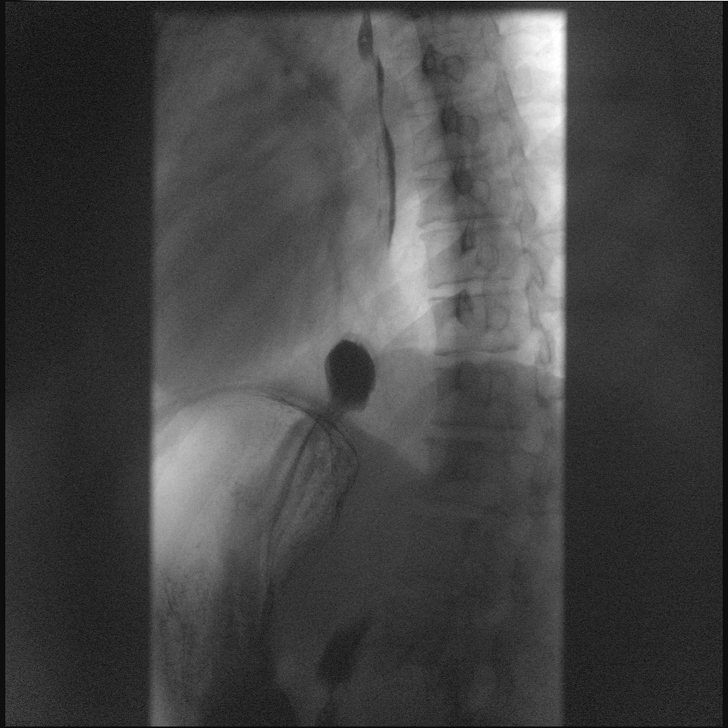

[Series 11: cp_standard · 1 of 52 frames shown (5 of 6)]
[frame 8/52]
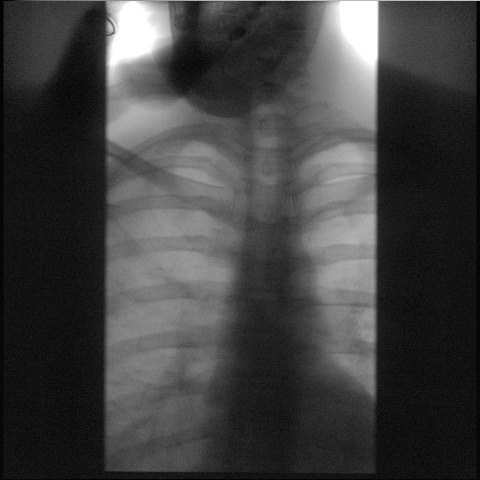

[Series 12: cp_standard · 1 of 1 slices shown (6 of 6)]
[im 1/1]
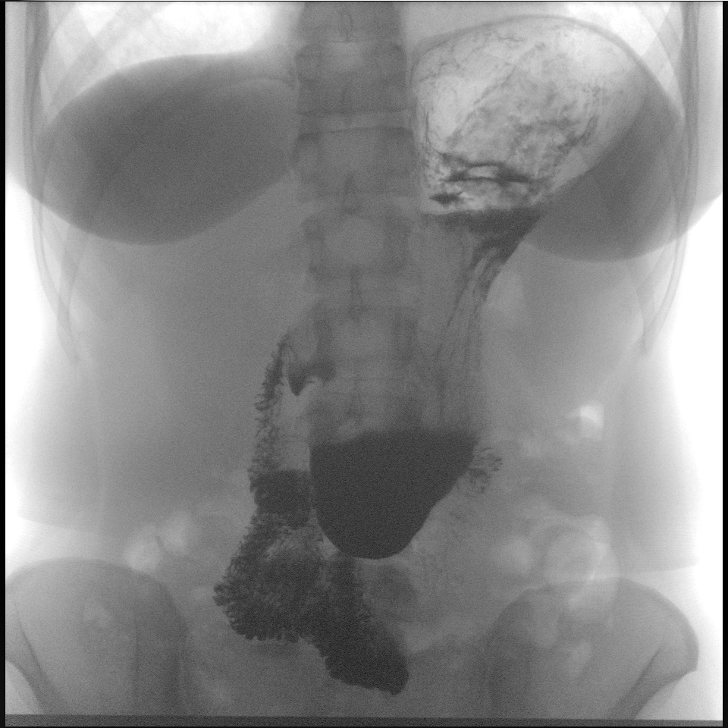

[13 of 24 positions shown; findings below may reference images not displayed]

FINDINGS: Fluoroscopic evaluation of the pharynx during rapid sequence imaging
with swallows of barium reveal no focal abnormalities.

There is no evidence of esophageal stricture or diverticulum. The 13
mm barium tablet passed into the stomach without difficulty.
Esophageal motility is normal.

There is no evidence of hiatal hernia and no gastroesophageal reflux
was seen during the examination or elicited with Valsalva maneuvers.

Limited visualization of the stomach and duodenal bulb are normal.
Normal duodenal sweep.
IMPRESSION: Normal upper GI examination. Normal esophageal motility without
findings of stricture or diverticulum. No findings of hiatal hernia
or gastroesophageal reflux on this exam.

## 2023-05-16 IMAGING — US US PELVIS COMPLETE
1 series · 14 of 25 positions shown · non-contrast
Comparison: None Available.

CLINICAL DATA: Left lower quadrant pain

EXAM:
TRANSABDOMINAL ULTRASOUND OF PELVIS
TECHNIQUE: Transabdominal ultrasound examination of the pelvis was performed
including evaluation of the uterus, ovaries, adnexal regions, and
pelvic cul-de-sac.

[Series 1: us pelvis complete · 0.18mm/px · 14 of 44 slices shown]
[im 1/44]
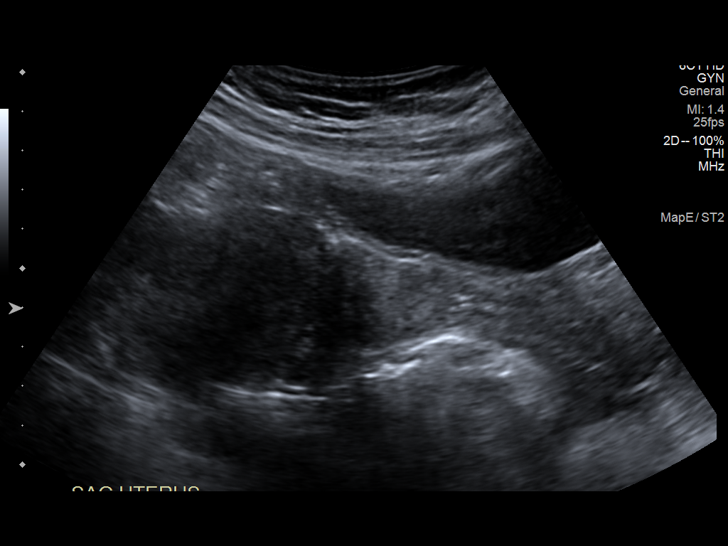
[im 4/44]
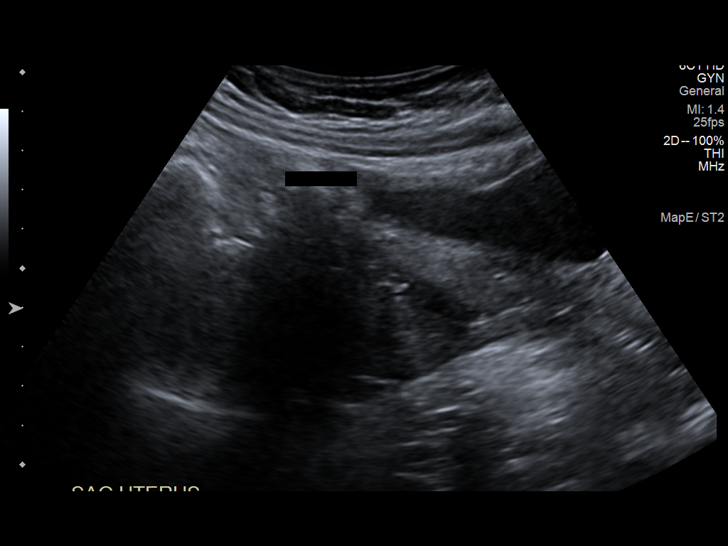
[im 8/44]
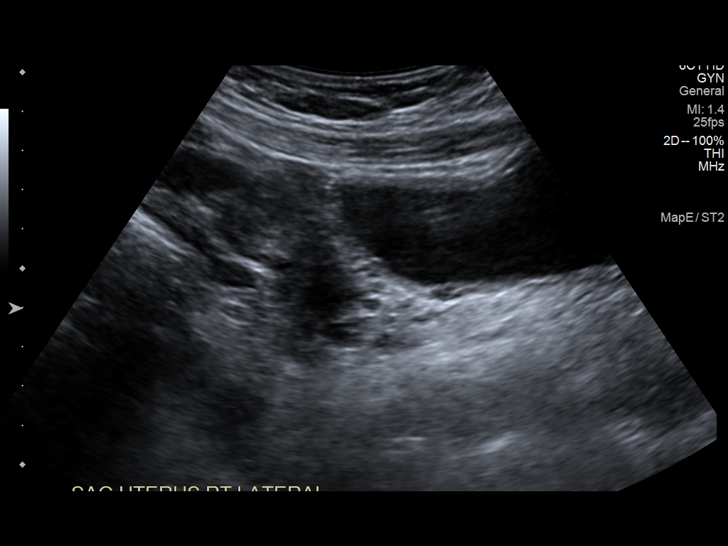
[im 11/44]
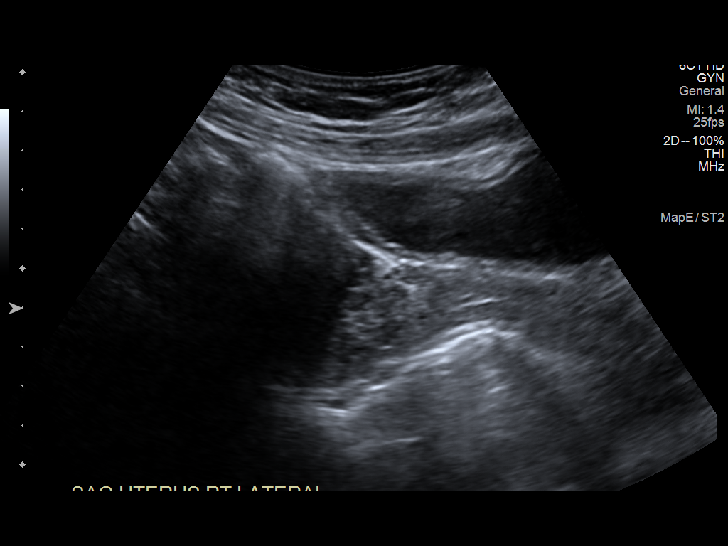
[im 15/44]
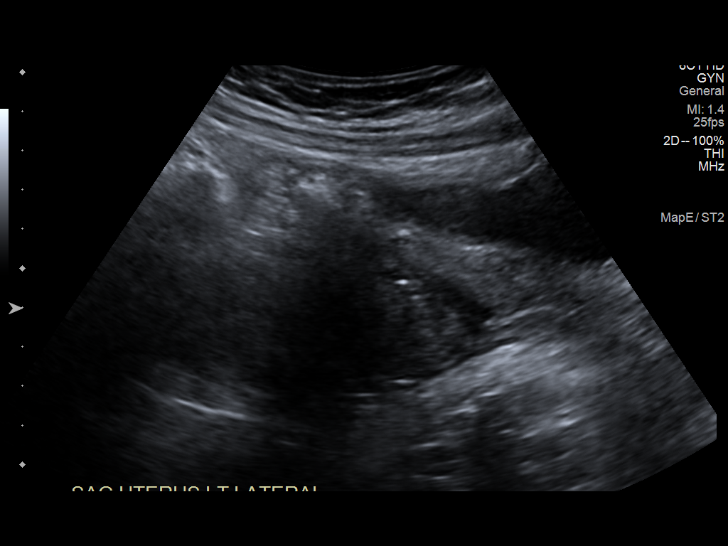
[im 17/44]
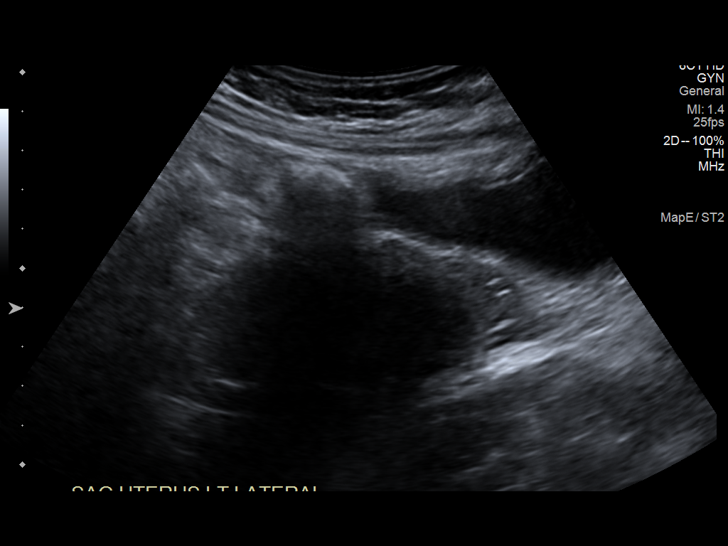
[im 20/44]
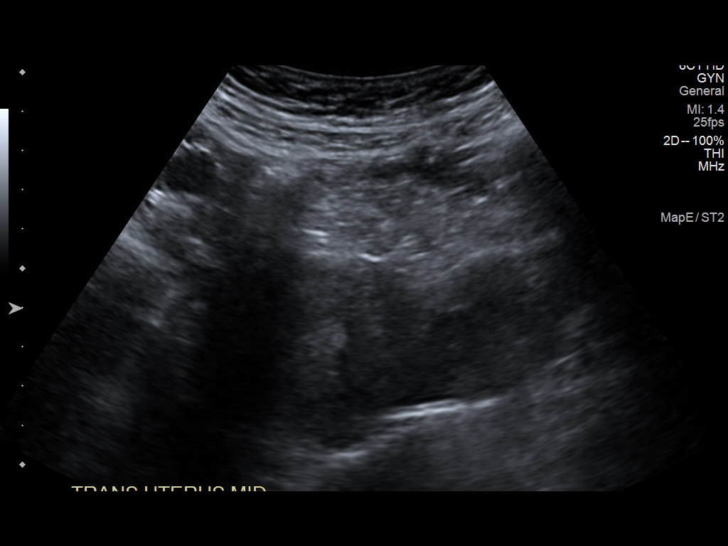
[im 24/44]
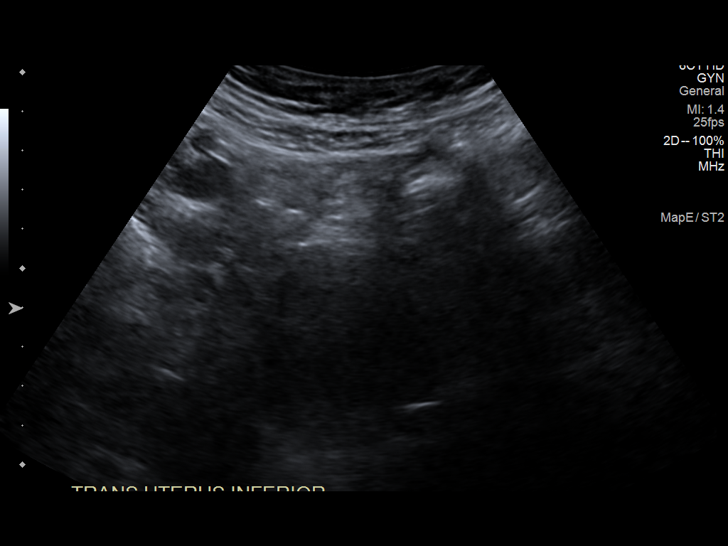
[im 27/44]
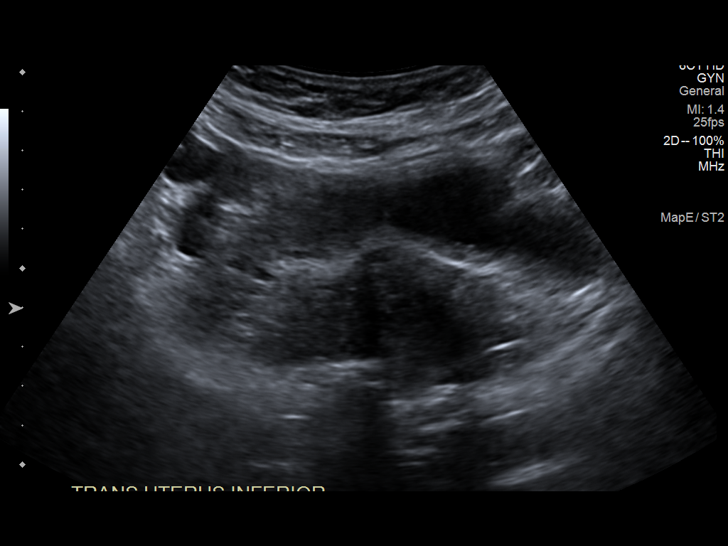
[im 29/44]
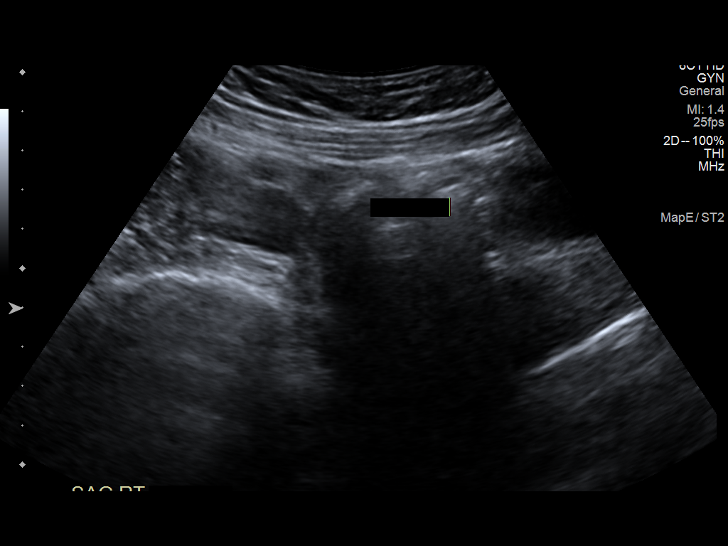
[im 33/44]
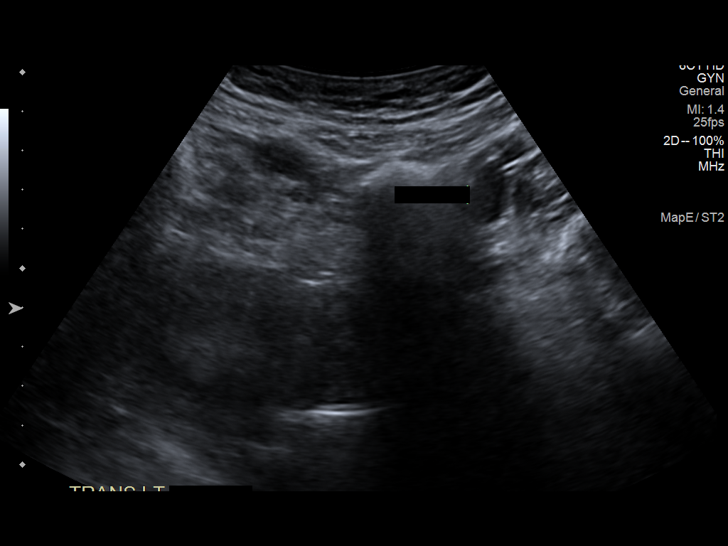
[im 36/44]
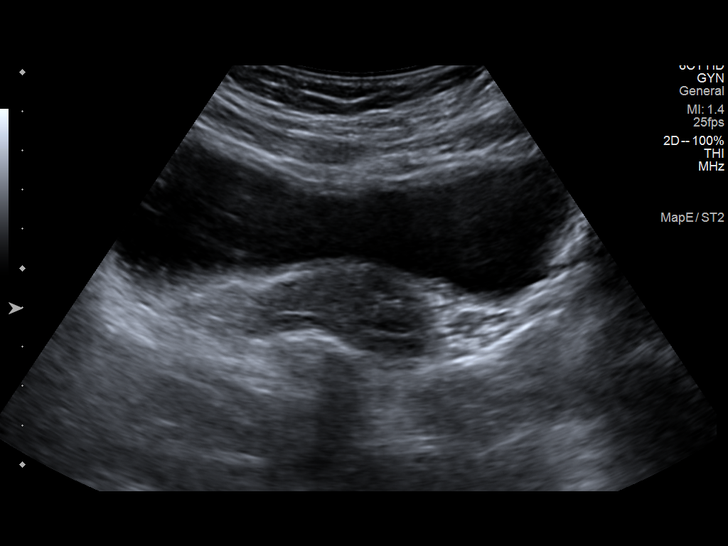
[im 40/44]
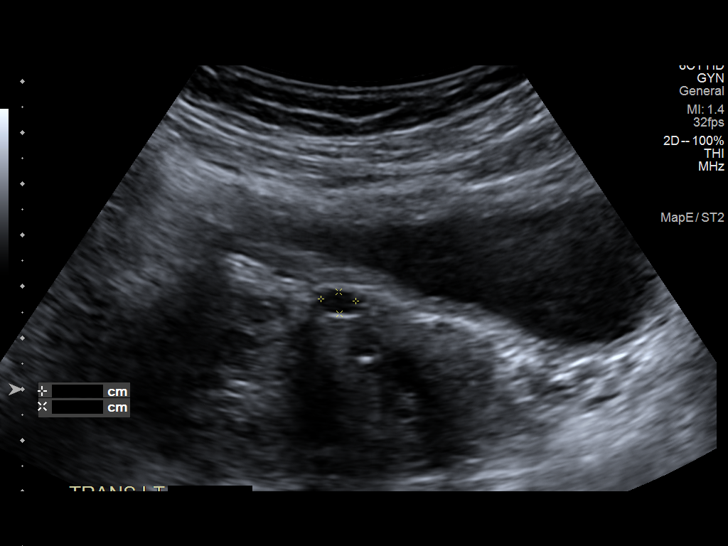
[im 44/44]
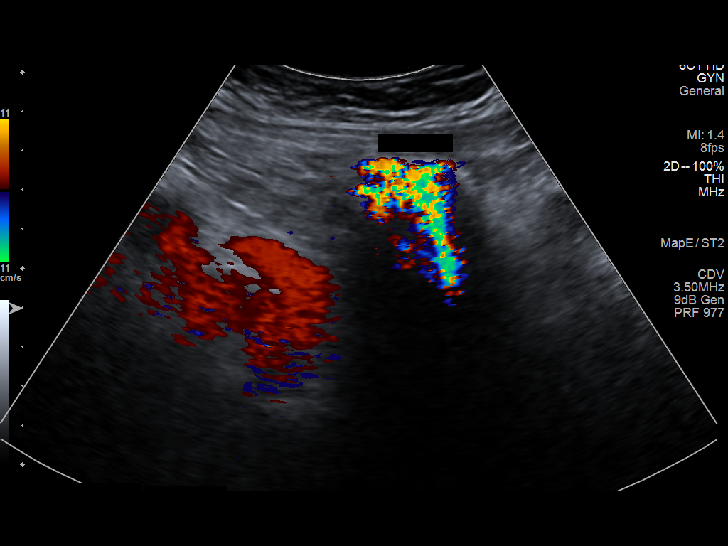

[14 of 25 positions shown; findings below may reference images not displayed]

FINDINGS: Uterus

Limited visualization. Measurements: 14.6 x 4.4 x 5.0 cm = volume:
168 mL. No fibroids or other mass visualized. Probable subcentimeter
nabothian cyst near the cervix.

Endometrium

Thickness: 0.1 cm.  No focal abnormality visualized.

Right ovary

Not visualized.

Left ovary

Not visualized.

Other findings:  No abnormal free fluid.
IMPRESSION: Limited, transabdominal only ultrasound examination of the pelvis.
The ovaries are nonvisualized. Within this limitation, no acute
findings to explain pain. Consider CT or MRI to further evaluate
otherwise unexplained pain.

## 2023-06-03 DIAGNOSIS — J0140 Acute pansinusitis, unspecified: Secondary | ICD-10-CM | POA: Diagnosis not present

## 2023-06-03 DIAGNOSIS — Z6831 Body mass index (BMI) 31.0-31.9, adult: Secondary | ICD-10-CM | POA: Diagnosis not present

## 2023-06-16 DIAGNOSIS — M9901 Segmental and somatic dysfunction of cervical region: Secondary | ICD-10-CM | POA: Diagnosis not present

## 2023-06-16 DIAGNOSIS — R519 Headache, unspecified: Secondary | ICD-10-CM | POA: Diagnosis not present

## 2023-06-16 DIAGNOSIS — M5416 Radiculopathy, lumbar region: Secondary | ICD-10-CM | POA: Diagnosis not present

## 2023-06-16 DIAGNOSIS — M9903 Segmental and somatic dysfunction of lumbar region: Secondary | ICD-10-CM | POA: Diagnosis not present

## 2023-06-27 DIAGNOSIS — R519 Headache, unspecified: Secondary | ICD-10-CM | POA: Diagnosis not present

## 2023-06-27 DIAGNOSIS — M5416 Radiculopathy, lumbar region: Secondary | ICD-10-CM | POA: Diagnosis not present

## 2023-06-27 DIAGNOSIS — M9903 Segmental and somatic dysfunction of lumbar region: Secondary | ICD-10-CM | POA: Diagnosis not present

## 2023-06-27 DIAGNOSIS — M9901 Segmental and somatic dysfunction of cervical region: Secondary | ICD-10-CM | POA: Diagnosis not present

## 2023-07-04 DIAGNOSIS — Z6831 Body mass index (BMI) 31.0-31.9, adult: Secondary | ICD-10-CM | POA: Diagnosis not present

## 2023-07-04 DIAGNOSIS — B3731 Acute candidiasis of vulva and vagina: Secondary | ICD-10-CM | POA: Diagnosis not present

## 2023-07-18 DIAGNOSIS — R519 Headache, unspecified: Secondary | ICD-10-CM | POA: Diagnosis not present

## 2023-07-18 DIAGNOSIS — M9903 Segmental and somatic dysfunction of lumbar region: Secondary | ICD-10-CM | POA: Diagnosis not present

## 2023-07-18 DIAGNOSIS — M9901 Segmental and somatic dysfunction of cervical region: Secondary | ICD-10-CM | POA: Diagnosis not present

## 2023-07-18 DIAGNOSIS — M5416 Radiculopathy, lumbar region: Secondary | ICD-10-CM | POA: Diagnosis not present

## 2023-08-16 DIAGNOSIS — R0602 Shortness of breath: Secondary | ICD-10-CM | POA: Diagnosis not present

## 2023-08-16 DIAGNOSIS — Z6831 Body mass index (BMI) 31.0-31.9, adult: Secondary | ICD-10-CM | POA: Diagnosis not present

## 2023-08-16 DIAGNOSIS — J209 Acute bronchitis, unspecified: Secondary | ICD-10-CM | POA: Diagnosis not present

## 2024-04-19 ENCOUNTER — Ambulatory Visit: Payer: Self-pay

## 2024-04-19 NOTE — Telephone Encounter (Signed)
 FYI Only or Action Required?: FYI only for provider.  Patient was last seen in primary care on 03/24/2023 by Caro Harlene POUR, NP.  Called Nurse Triage reporting Skin Problem.  Symptoms began several days ago.  Interventions attempted: Rest, hydration, or home remedies.  Symptoms are: unchanged.  Triage Disposition: See PCP When Office is Open (Within 3 Days)  Patient/caregiver understands and will follow disposition?: Yes  Copied from CRM #[id number]. Topic: Clinical - Red Word Triage >> Apr 19, 2024 11:40 AM Mercer PEDLAR wrote: Red Word that prompted transfer to Nurse Triage: Swollen lump in armpit and side of chest. Reason for Disposition  [1] Small swelling or lump AND [2] unexplained AND [3] persists > 1 week  Answer Assessment - Initial Assessment Questions 1. APPEARANCE of SWELLING: What does it look like?     Per patient slightly swollen 2. SIZE: How large is the swelling? (inches, cm or compare to coins)     Size of a dime 3. LOCATION: Where is the swelling located?     Located left side under armpit into chest 4. ONSET: When did the swelling start?     4-5 days ago 5. PAIN: Is it painful? If so, ask: How much?     Painful with touch where patient is pressing into the skin 6. ITCH: Does it itch? If so, ask: How much?     itching 7. CAUSE: What do you think caused the swelling?     Unsure but patient states she finished her menstrual cycle two-three days ago. She is unsure if this could be hormone related.  8. NODE: Does it feel like a lymph node? (Note: nodes have a boundary or edge and are movable, unlike most insect bites)     Patient is unsure-states that it is moveable but patient is unsure if it could be a node.  Protocols used: Skin - Lump or Localized Swelling-P-AH

## 2024-04-22 ENCOUNTER — Ambulatory Visit (INDEPENDENT_AMBULATORY_CARE_PROVIDER_SITE_OTHER): Admitting: Nurse Practitioner

## 2024-04-22 ENCOUNTER — Other Ambulatory Visit: Payer: Self-pay | Admitting: Nurse Practitioner

## 2024-04-22 ENCOUNTER — Encounter: Payer: Self-pay | Admitting: Nurse Practitioner

## 2024-04-22 VITALS — BP 104/78 | HR 82 | Temp 97.6°F | Resp 13 | Ht 68.5 in | Wt 181.8 lb

## 2024-04-22 DIAGNOSIS — M79622 Pain in left upper arm: Secondary | ICD-10-CM

## 2024-04-22 DIAGNOSIS — D509 Iron deficiency anemia, unspecified: Secondary | ICD-10-CM | POA: Diagnosis not present

## 2024-04-22 DIAGNOSIS — D649 Anemia, unspecified: Secondary | ICD-10-CM | POA: Diagnosis not present

## 2024-04-22 NOTE — Progress Notes (Signed)
 Careteam: Patient Care Team: Caro Harlene POUR, NP as PCP - General (Geriatric Medicine)  PLACE OF SERVICE:  [facility]  Advanced Directive information    Allergies  Allergen Reactions   Peanut-Containing Drug Products Anaphylaxis    Chief Complaint  Patient presents with   Acute Visit    Patient stated left underarm feels a little weird ( lumpy ) pt stated that it was swollen but it has went down.     HPI:  Discussed the use of AI scribe software for clinical note transcription with the patient, who gave verbal consent to proceed.  History of Present Illness Gabrielle Hughes is a 33 year old female who presents with non-painful knots under her skin in the armpit area.  She has noticed non-painful knots under her skin in the armpit area for about a month. These knots become painful and slightly swell when irritated, such as during excessive exercise or frequent touching. She performs monthly self-examinations and first noticed the knots during these checks. The knots were more swollen when she made the appointment last week, coinciding with her menstrual cycle, but the swelling has since reduced.  She experiences sharp breast pains approximately once a day, which she describes as 'weird' but not concerning. This has been occurring for a couple of months. She is not on any contraceptive and had her tubes tied after her last pregnancy. She experienced an ectopic pregnancy over a year ago, which was managed conservatively after a miscarriage was suspected.  No shortness of breath or cough. She reports tenderness in the armpit area when touched, but no pain when raising her arm.    Review of Systems:  Review of Systems  Constitutional:  Negative for chills, fever and weight loss.  HENT:  Negative for tinnitus.   Respiratory:  Negative for cough, sputum production and shortness of breath.   Cardiovascular:  Negative for chest pain, palpitations and leg swelling.   Gastrointestinal:  Negative for abdominal pain, constipation, diarrhea and heartburn.  Genitourinary:  Negative for dysuria, frequency and urgency.  Musculoskeletal:  Negative for back pain, falls, joint pain and myalgias.  Skin: Negative.   Neurological:  Negative for dizziness and headaches.    Past Medical History:  Diagnosis Date   Complication of anesthesia    Gestational diabetes    PONV (postoperative nausea and vomiting)    Past Surgical History:  Procedure Laterality Date   BUNIONECTOMY     rt and left   CESAREAN SECTION N/A 05/23/2018   Procedure: CESAREAN SECTION;  Surgeon: Gorge Ade, MD;  Location: [facility];  Service: Obstetrics;  Laterality: N/A;   CESAREAN SECTION WITH BILATERAL TUBAL LIGATION Bilateral 05/26/2019   Procedure: Repeat CESAREAN SECTION WITH BILATERAL TUBAL LIGATION;  Surgeon: Gorge Ade, MD;  Location: [facility];  Service: Obstetrics;  Laterality: Bilateral;  EDD: 06/02/19 Allergy: Peanuts   Social History:   reports that she has never smoked. She has never used smokeless tobacco. She reports current alcohol use. She reports that she does not use drugs.  Family History  Problem Relation Age of Onset   Hypertension Mother    Cancer Paternal Grandfather        lung    Medications: Patient's Medications   No medications on file    Physical Exam:  Vitals:   04/22/24 1422  BP: 104/78  Pulse: 82  Resp: 13  Temp: 97.6 F (36.4 C)  SpO2: 99%  Weight: 181 lb 12.8 oz (82.5 kg)  Height: 5' 8.5 (1.74 m)   Body mass index is 27.24 kg/m. Wt Readings from Last 3 Encounters:  04/22/24 181 lb 12.8 oz (82.5 kg)  03/24/23 215 lb (97.5 kg)  02/02/23 209 lb 12.8 oz (95.2 kg)    Physical Exam Constitutional:      General: She is not in acute distress.    Appearance: She is well-developed. She is not diaphoretic.  HENT:     Head: Normocephalic and atraumatic.     Mouth/Throat:     Pharynx: No oropharyngeal exudate.  Eyes:      Conjunctiva/sclera: Conjunctivae normal.     Pupils: Pupils are equal, round, and reactive to light.  Cardiovascular:     Rate and Rhythm: Normal rate and regular rhythm.     Heart sounds: Normal heart sounds.  Pulmonary:     Effort: Pulmonary effort is normal.     Breath sounds: Normal breath sounds.  Chest:     Chest wall: No mass.  Breasts:    Right: Normal.     Left: Normal.     Comments: Tenderness under left axillary area Abdominal:     General: Bowel sounds are normal.     Palpations: Abdomen is soft.  Musculoskeletal:     Cervical back: Normal range of motion and neck supple.     Right lower leg: No edema.     Left lower leg: No edema.  Lymphadenopathy:     Upper Body:     Left upper body: No supraclavicular, axillary or pectoral adenopathy.  Skin:    General: Skin is warm and dry.  Neurological:     Mental Status: She is alert.  Psychiatric:        Mood and Affect: Mood normal.     Labs reviewed: Basic Metabolic Panel: No results for input(s): NA, K, CL, CO2, GLUCOSE, BUN, CREATININE, CALCIUM, MG, PHOS, TSH in the last 8760 hours. Liver Function Tests: No results for input(s): AST, ALT, ALKPHOS, BILITOT, PROT, ALBUMIN in the last 8760 hours. No results for input(s): LIPASE, AMYLASE in the last 8760 hours. No results for input(s): AMMONIA in the last 8760 hours. CBC: No results for input(s): WBC, NEUTROABS, HGB, HCT, MCV, PLT in the last 8760 hours. Lipid Panel: No results for input(s): CHOL, HDL, LDLCALC, TRIG, CHOLHDL, LDLDIRECT in the last 8760 hours. TSH: No results for input(s): TSH in the last 8760 hours. A1C: No results found for: HGBA1C   Assessment/Plan Assessment and Plan   Axillary tenderness, left Non-painful axillary lymphadenopathy for one month, exacerbated by exercise and menstrual cycle. hormonal changes could contribute,  No signs of infection. -     US  AXILLA LEFT;  Future -     CBC with Differential/Platelet -     Comprehensive metabolic panel with GFR     Return in about 3 months (around 07/23/2024) for CPE.  Bonni Neuser K. Caro BODILY [facility] [phone]

## 2024-04-23 ENCOUNTER — Ambulatory Visit: Payer: Self-pay | Admitting: Nurse Practitioner

## 2024-04-23 DIAGNOSIS — D509 Iron deficiency anemia, unspecified: Secondary | ICD-10-CM

## 2024-04-23 LAB — COMPREHENSIVE METABOLIC PANEL WITH GFR
AG Ratio: 1.6 (calc) (ref 1.0–2.5)
ALT: 9 U/L (ref 6–29)
AST: 14 U/L (ref 10–30)
Albumin: 4.4 g/dL (ref 3.6–5.1)
Alkaline phosphatase (APISO): 63 U/L (ref 31–125)
BUN: 11 mg/dL (ref 7–25)
CO2: 25 mmol/L (ref 20–32)
Calcium: 9.4 mg/dL (ref 8.6–10.2)
Chloride: 106 mmol/L (ref 98–110)
Creat: 0.64 mg/dL (ref 0.50–0.97)
Globulin: 2.7 g/dL (ref 1.9–3.7)
Glucose, Bld: 78 mg/dL (ref 65–99)
Potassium: 3.8 mmol/L (ref 3.5–5.3)
Sodium: 138 mmol/L (ref 135–146)
Total Bilirubin: 0.5 mg/dL (ref 0.2–1.2)
Total Protein: 7.1 g/dL (ref 6.1–8.1)
eGFR: 119 mL/min/1.73m2 (ref >=60)

## 2024-04-23 LAB — CBC WITH DIFFERENTIAL/PLATELET
Absolute Lymphocytes: 2464 {cells}/uL (ref 850–3900)
Absolute Monocytes: 496 {cells}/uL (ref 200–950)
Basophils Absolute: 37 {cells}/uL (ref 0–200)
Basophils Relative: 0.5 %
Eosinophils Absolute: 74 {cells}/uL (ref 15–500)
Eosinophils Relative: 1 %
HCT: 37.1 % (ref 35.0–45.0)
Hemoglobin: 11.4 g/dL — ABNORMAL LOW (ref 11.7–15.5)
MCH: 25 pg — ABNORMAL LOW (ref 27.0–33.0)
MCHC: 30.7 g/dL — ABNORMAL LOW (ref 32.0–36.0)
MCV: 81.4 fL (ref 80.0–100.0)
MPV: 12.8 fL — ABNORMAL HIGH (ref 7.5–12.5)
Monocytes Relative: 6.7 %
Neutro Abs: 4329 {cells}/uL (ref 1500–7800)
Neutrophils Relative %: 58.5 %
Platelets: 257 Thousand/uL (ref 140–400)
RBC: 4.56 Million/uL (ref 3.80–5.10)
RDW: 14.1 % (ref 11.0–15.0)
Total Lymphocyte: 33.3 %
WBC: 7.4 Thousand/uL (ref 3.8–10.8)

## 2024-04-23 LAB — TEST AUTHORIZATION

## 2024-04-23 LAB — IRON,TIBC AND FERRITIN PANEL
%SAT: 9 % — ABNORMAL LOW (ref 16–45)
Ferritin: 5 ng/mL — ABNORMAL LOW (ref 16–154)
Iron: 34 ug/dL — ABNORMAL LOW (ref 40–190)
TIBC: 362 ug/dL (ref 250–450)

## 2024-04-24 DIAGNOSIS — Z130 Encounter for screening for diseases of the blood and blood-forming organs and certain disorders involving the immune mechanism: Secondary | ICD-10-CM

## 2024-04-24 NOTE — Progress Notes (Signed)
 This encounter was created in error - please disregard.

## 2024-04-26 ENCOUNTER — Ambulatory Visit
Admission: RE | Admit: 2024-04-26 | Discharge: 2024-04-26 | Disposition: A | Discharge status: Home / Self Care | Source: Ambulatory Visit | Attending: Nurse Practitioner | Admitting: Nurse Practitioner

## 2024-04-26 DIAGNOSIS — M79622 Pain in left upper arm: Secondary | ICD-10-CM

## 2024-04-26 DIAGNOSIS — R928 Other abnormal and inconclusive findings on diagnostic imaging of breast: Secondary | ICD-10-CM | POA: Diagnosis not present

## 2024-04-26 DIAGNOSIS — N6489 Other specified disorders of breast: Secondary | ICD-10-CM | POA: Diagnosis not present

## 2024-05-09 ENCOUNTER — Other Ambulatory Visit

## 2024-05-09 DIAGNOSIS — D509 Iron deficiency anemia, unspecified: Secondary | ICD-10-CM

## 2024-05-10 ENCOUNTER — Ambulatory Visit: Payer: Self-pay | Admitting: Nurse Practitioner

## 2024-05-10 LAB — CBC WITH DIFFERENTIAL/PLATELET
Absolute Lymphocytes: 1877 {cells}/uL (ref 850–3900)
Absolute Monocytes: 400 {cells}/uL (ref 200–950)
Basophils Absolute: 28 {cells}/uL (ref 0–200)
Basophils Relative: 0.4 %
Eosinophils Absolute: 69 {cells}/uL (ref 15–500)
Eosinophils Relative: 1 %
HCT: 36.4 % (ref 35.0–45.0)
Hemoglobin: 11.3 g/dL — ABNORMAL LOW (ref 11.7–15.5)
MCH: 25.6 pg — ABNORMAL LOW (ref 27.0–33.0)
MCHC: 31 g/dL — ABNORMAL LOW (ref 32.0–36.0)
MCV: 82.5 fL (ref 80.0–100.0)
MPV: 13.1 fL — ABNORMAL HIGH (ref 7.5–12.5)
Monocytes Relative: 5.8 %
Neutro Abs: 4526 {cells}/uL (ref 1500–7800)
Neutrophils Relative %: 65.6 %
Platelets: 200 Thousand/uL (ref 140–400)
RBC: 4.41 Million/uL (ref 3.80–5.10)
RDW: 15 % (ref 11.0–15.0)
Total Lymphocyte: 27.2 %
WBC: 6.9 Thousand/uL (ref 3.8–10.8)

## 2024-08-02 ENCOUNTER — Ambulatory Visit: Payer: Self-pay | Admitting: Nurse Practitioner

## 2024-08-02 ENCOUNTER — Encounter: Payer: Self-pay | Admitting: Nurse Practitioner

## 2024-08-02 VITALS — BP 122/78 | HR 68 | Temp 97.8°F | Ht 68.5 in | Wt 167.8 lb

## 2024-08-02 DIAGNOSIS — Z0000 Encounter for general adult medical examination without abnormal findings: Secondary | ICD-10-CM

## 2024-08-02 DIAGNOSIS — D509 Iron deficiency anemia, unspecified: Secondary | ICD-10-CM

## 2024-08-02 NOTE — Progress Notes (Signed)
 Provider: Caro Harlene POUR, NP  Patient Care Team: Caro Harlene POUR, NP as PCP - General (Geriatric Medicine)  Extended Emergency Contact Information Primary Emergency Contact: [facility] Phone: [phone] Relation: Spouse Allergies  Allergen Reactions   Peanut-Containing Drug Products Anaphylaxis   Code Status: FULL Goals of Care: Advanced Directive information    03/24/2023    9:51 AM  Advanced Directives  Does Patient Have a Medical Advance Directive? No  Would patient like information on creating a medical advance directive? No - Patient declined     Chief Complaint  Patient presents with   Annual Exam    Pt declined vaccines , patient stated she hasn't had pap smear.     HPI: Patient is a 33 y.o. female seen in today for an wellness exam at [facility]  -Discussed the use of AI scribe software for clinical note transcription with the patient, who gave verbal consent to proceed.  History of Present Illness Kasha Rollinson is a 33 year old female who presents for an annual physical exam.  She has a history of anemia with low iron  levels and is currently taking an iron  supplement, which causes constipation. Her diet is low in red meat and dairy, potentially contributing to her low iron  levels. She experiences very heavy and inconsistent menstrual periods, with alternating light and heavy days over the past six months. No tarry or dark stools are noted.  She has lost approximately 60 pounds through dietary changes and increased physical activity, including weightlifting. Despite this weight loss, she struggles to lose weight in her hips and legs and reports 'thin skin' and the ability to feel subcutaneous fat more prominently. She also notes hair loss, dislikes protein, and may not be consuming enough.  Her cholesterol levels were last checked over a year ago and were normal. She had lab work done in July and has had her blood tested twice recently due to  anemia concerns.  She has never received a flu shot and expresses reluctance to get one. She recalls having a hepatitis C screening during pregnancy and received hepatitis B vaccines when she started her job in 2016. She is unsure of the exact dates of the hepatitis B vaccinations.       08/02/2024    1:11 PM 04/22/2024    2:21 PM 03/24/2023    9:50 AM 02/09/2022    1:43 PM 04/06/2018    7:32 AM  Depression screen PHQ 2/9  Decreased Interest 0 0 0 0 0  Down, Depressed, Hopeless 0 0 0 0 0  PHQ - 2 Score 0 0 0 0 0       05/27/2019    8:33 AM 05/27/2019    9:14 PM 05/28/2019    8:23 AM 02/09/2022    1:43 PM 03/24/2023    9:50 AM  Fall Risk  Falls in the past year?    0 0  Was there an injury with Fall?    0 0  Fall Risk Category Calculator    0 0  Fall Risk Category (Retired)    Low    (RETIRED) Patient Fall Risk Level Low fall risk  Low fall risk  Low fall risk  Low fall risk    Patient at Risk for Falls Due to    No Fall Risks No Fall Risks  Fall risk Follow up    Falls evaluation completed  Falls evaluation completed     Data saved with a previous flowsheet row definition  No data to display           Health Maintenance  Topic Date Due   Hepatitis C Screening  Never done   Hepatitis B Vaccines 19-59 Average Risk (1 of 3 - 19+ 3-dose series) Never done   HPV VACCINES (1 - 3-dose SCDM series) Never done   Cervical Cancer Screening (HPV/Pap Cotest)  Never done   Influenza Vaccine  Never done   COVID-19 Vaccine (3 - 2025-26 season) 05/27/2024   DTaP/Tdap/Td (2 - Td or Tdap) 04/04/2028   HIV Screening  Completed   Pneumococcal Vaccine  Aged Out   Meningococcal B Vaccine  Aged Out    Past Medical History:  Diagnosis Date   Complication of anesthesia    Gestational diabetes    PONV (postoperative nausea and vomiting)     Past Surgical History:  Procedure Laterality Date   BUNIONECTOMY     rt and left   CESAREAN SECTION N/A 05/23/2018   Procedure: CESAREAN  SECTION;  Surgeon: Gorge Ade, MD;  Location: [facility];  Service: Obstetrics;  Laterality: N/A;   CESAREAN SECTION WITH BILATERAL TUBAL LIGATION Bilateral 05/26/2019   Procedure: Repeat CESAREAN SECTION WITH BILATERAL TUBAL LIGATION;  Surgeon: Gorge Ade, MD;  Location: [facility];  Service: Obstetrics;  Laterality: Bilateral;  EDD: 06/02/19 Allergy: Peanuts    Social History   Socioeconomic History   Marital status: Married    Spouse name: Not on file   Number of children: Not on file   Years of education: Not on file   Highest education level: Not on file  Occupational History   Not on file  Tobacco Use   Smoking status: Never   Smokeless tobacco: Never  Vaping Use   Vaping status: Never Used  Substance and Sexual Activity   Alcohol use: Yes    Comment: socially   Drug use: Never   Sexual activity: Not Currently    Birth control/protection: Surgical    Comment: tubial ligation  Other Topics Concern   Not on file  Social History Narrative   Not on file   Social Drivers of Health   Financial Resource Strain: Low Risk  (05/16/2018)   Overall Financial Resource Strain (CARDIA)    Difficulty of Paying Living Expenses: Not hard at all  Food Insecurity: No Food Insecurity (05/16/2018)   Hunger Vital Sign    Worried About Running Out of Food in the Last Year: Never true    Ran Out of Food in the Last Year: Never true  Transportation Needs: No Transportation Needs (05/16/2018)   PRAPARE - Administrator, Civil Service (Medical): No    Lack of Transportation (Non-Medical): No  Physical Activity: Inactive (05/16/2018)   Exercise Vital Sign    Days of Exercise per Week: 0 days    Minutes of Exercise per Session: 0 min  Stress: No Stress Concern Present (05/16/2018)   [employer] of Occupational Health - Occupational Stress Questionnaire    Feeling of Stress : Not at all  Social Connections: Not on file    Family History  Problem Relation Age  of Onset   Hypertension Mother    Cancer Paternal Grandfather        lung    Review of Systems:  Review of Systems  Constitutional:  Negative for chills, fever and weight loss.  HENT:  Negative for tinnitus.   Respiratory:  Negative for cough, sputum production and shortness of breath.   Cardiovascular:  Negative for chest pain, palpitations and leg swelling.  Gastrointestinal:  Negative for abdominal pain, constipation, diarrhea and heartburn.  Genitourinary:  Negative for dysuria, frequency and urgency.  Musculoskeletal:  Negative for back pain, falls, joint pain and myalgias.  Skin: Negative.   Neurological:  Negative for dizziness and headaches.  Psychiatric/Behavioral:  Negative for depression and memory loss. The patient does not have insomnia.      Allergies as of 08/02/2024       Reactions   Peanut-containing Drug Products Anaphylaxis        Medication List    as of August 02, 2024  1:33 PM   You have not been prescribed any medications.       Physical Exam: Vitals:   08/02/24 1312  BP: 122/78  Pulse: 68  Temp: 97.8 F (36.6 C)  SpO2: 99%  Weight: 167 lb 12.8 oz (76.1 kg)  Height: 5' 8.5 (1.74 m)   Body mass index is 25.14 kg/m. Wt Readings from Last 3 Encounters:  08/02/24 167 lb 12.8 oz (76.1 kg)  04/22/24 181 lb 12.8 oz (82.5 kg)  03/24/23 215 lb (97.5 kg)    Physical Exam Constitutional:      General: She is not in acute distress.    Appearance: She is well-developed. She is not diaphoretic.  HENT:     Head: Normocephalic and atraumatic.     Mouth/Throat:     Pharynx: No oropharyngeal exudate.  Eyes:     Conjunctiva/sclera: Conjunctivae normal.     Pupils: Pupils are equal, round, and reactive to light.  Cardiovascular:     Rate and Rhythm: Normal rate and regular rhythm.     Heart sounds: Normal heart sounds.  Pulmonary:     Effort: Pulmonary effort is normal.     Breath sounds: Normal breath sounds.  Abdominal:     General:  Bowel sounds are normal.     Palpations: Abdomen is soft.  Musculoskeletal:     Cervical back: Normal range of motion and neck supple.     Right lower leg: No edema.     Left lower leg: No edema.  Skin:    General: Skin is warm and dry.  Neurological:     Mental Status: She is alert.  Psychiatric:        Mood and Affect: Mood normal.     Labs reviewed: Basic Metabolic Panel: Recent Labs    04/22/24 1449  NA 138  K 3.8  CL 106  CO2 25  GLUCOSE 78  BUN 11  CREATININE 0.64  CALCIUM 9.4   Liver Function Tests: Recent Labs    04/22/24 1449  AST 14  ALT 9  BILITOT 0.5  PROT 7.1   No results for input(s): LIPASE, AMYLASE in the last 8760 hours. No results for input(s): AMMONIA in the last 8760 hours. CBC: Recent Labs    04/22/24 1449 05/09/24 1103  WBC 7.4 6.9  NEUTROABS 4,329 4,526  HGB 11.4* 11.3*  HCT 37.1 36.4  MCV 81.4 82.5  PLT 257 200   Lipid Panel: No results for input(s): CHOL, HDL, LDLCALC, TRIG, CHOLHDL, LDLDIRECT in the last 8760 hours. No results found for: HGBA1C  Procedures: No results found.  Assessment/Plan Assessment and Plan Assessment & Plan Adult Wellness Visit Annual wellness visit conducted. Discussed flu vaccination importance, dietary habits, and weight management strategies.  -wellness visit completed today, The patient was counseled regarding the appropriate use of alcohol, regular self-examination of the breasts on a monthly  basis, prevention of dental and periodontal disease, diet, regular sustained exercise for at least 30 minutes 5 times per week, routine screening interval for mammogram as recommended by the American Cancer Society and ACOG, importance of regular PAP smears, and recommended schedule for GI hemoccult testing, colonoscopy, cholesterol, thyroid and diabetes screening. - Recommended flu vascination. - Encouraged adequate protein intake   Iron  deficiency anemia Likely due to dietary changes  and heavy menstrual periods. Currently on iron  supplements causing constipation. No gastrointestinal bleeding. Menstrual flow inconsistent. Discussed dietary iron  sources and protein intake importance. - Ordered blood work for iron  levels and blood counts. - Recommended stool softeners for constipation. - Provided information on iron -rich foods.     Next appt: yearly Margues Filippini K. Caro BODILY  [facility] [phone]

## 2024-08-03 LAB — CBC WITH DIFFERENTIAL/PLATELET
Absolute Lymphocytes: 2285 {cells}/uL (ref 850–3900)
Absolute Monocytes: 409 {cells}/uL (ref 200–950)
Basophils Absolute: 22 {cells}/uL (ref 0–200)
Basophils Relative: 0.3 %
Eosinophils Absolute: 117 {cells}/uL (ref 15–500)
Eosinophils Relative: 1.6 %
HCT: 37.5 % (ref 35.0–45.0)
Hemoglobin: 12 g/dL (ref 11.7–15.5)
MCH: 26.6 pg — ABNORMAL LOW (ref 27.0–33.0)
MCHC: 32 g/dL (ref 32.0–36.0)
MCV: 83.1 fL (ref 80.0–100.0)
MPV: 12.8 fL — ABNORMAL HIGH (ref 7.5–12.5)
Monocytes Relative: 5.6 %
Neutro Abs: 4468 {cells}/uL (ref 1500–7800)
Neutrophils Relative %: 61.2 %
Platelets: 227 Thousand/uL (ref 140–400)
RBC: 4.51 Million/uL (ref 3.80–5.10)
RDW: 13 % (ref 11.0–15.0)
Total Lymphocyte: 31.3 %
WBC: 7.3 Thousand/uL (ref 3.8–10.8)

## 2024-08-03 LAB — IRON,TIBC AND FERRITIN PANEL
%SAT: 42 % (ref 16–45)
Ferritin: 10 ng/mL — ABNORMAL LOW (ref 16–154)
Iron: 142 ug/dL (ref 40–190)
TIBC: 338 ug/dL (ref 250–450)

## 2024-08-04 ENCOUNTER — Ambulatory Visit: Payer: Self-pay | Admitting: Nurse Practitioner

## 2024-08-06 DIAGNOSIS — H6991 Unspecified Eustachian tube disorder, right ear: Secondary | ICD-10-CM | POA: Diagnosis not present

## 2024-08-06 DIAGNOSIS — Z6824 Body mass index (BMI) 24.0-24.9, adult: Secondary | ICD-10-CM | POA: Diagnosis not present

## 2025-08-15 ENCOUNTER — Encounter: Admitting: Nurse Practitioner
